# Patient Record
Sex: Female | Born: 1965 | Race: Black or African American | Hispanic: No | Marital: Married
Health system: Southern US, Community
[De-identification: ages and names within clinical notes are randomized; demographics above are authoritative.]

## PROBLEM LIST (undated history)

## (undated) DIAGNOSIS — T7840XA Allergy, unspecified, initial encounter: Secondary | ICD-10-CM

## (undated) DIAGNOSIS — D649 Anemia, unspecified: Secondary | ICD-10-CM

## (undated) DIAGNOSIS — M1990 Unspecified osteoarthritis, unspecified site: Secondary | ICD-10-CM

## (undated) DIAGNOSIS — F419 Anxiety disorder, unspecified: Secondary | ICD-10-CM

## (undated) DIAGNOSIS — G4730 Sleep apnea, unspecified: Secondary | ICD-10-CM

## (undated) HISTORY — PX: HIP ARTHROPLASTY: SHX981

## (undated) HISTORY — DX: Anxiety disorder, unspecified: F41.9

## (undated) HISTORY — PX: CHOLECYSTECTOMY: SHX55

## (undated) HISTORY — DX: Unspecified osteoarthritis, unspecified site: M19.90

## (undated) HISTORY — DX: Sleep apnea, unspecified: G47.30

## (undated) HISTORY — DX: Allergy, unspecified, initial encounter: T78.40XA

## (undated) HISTORY — DX: Anemia, unspecified: D64.9

## (undated) HISTORY — PX: BILATERAL CARPAL TUNNEL RELEASE: SHX6508

---

## 1997-02-18 HISTORY — PX: COLONOSCOPY: SHX174

## 1997-12-03 ENCOUNTER — Inpatient Hospital Stay (HOSPITAL_COMMUNITY): Admission: AD | Admit: 1997-12-03 | Discharge: 1997-12-05 | Payer: Self-pay | Admitting: Obstetrics and Gynecology

## 1998-04-24 ENCOUNTER — Other Ambulatory Visit: Admission: RE | Admit: 1998-04-24 | Discharge: 1998-04-24 | Payer: Self-pay | Admitting: Obstetrics and Gynecology

## 1999-04-26 ENCOUNTER — Other Ambulatory Visit: Admission: RE | Admit: 1999-04-26 | Discharge: 1999-04-26 | Payer: Self-pay | Admitting: Obstetrics and Gynecology

## 1999-09-03 ENCOUNTER — Ambulatory Visit (HOSPITAL_BASED_OUTPATIENT_CLINIC_OR_DEPARTMENT_OTHER): Admission: RE | Admit: 1999-09-03 | Discharge: 1999-09-03 | Payer: Self-pay | Admitting: Orthopedic Surgery

## 1999-10-08 ENCOUNTER — Ambulatory Visit (HOSPITAL_BASED_OUTPATIENT_CLINIC_OR_DEPARTMENT_OTHER): Admission: RE | Admit: 1999-10-08 | Discharge: 1999-10-08 | Payer: Self-pay | Admitting: Orthopedic Surgery

## 2000-06-22 ENCOUNTER — Inpatient Hospital Stay (HOSPITAL_COMMUNITY): Admission: AD | Admit: 2000-06-22 | Discharge: 2000-06-24 | Payer: Self-pay | Admitting: Obstetrics and Gynecology

## 2000-07-30 ENCOUNTER — Other Ambulatory Visit: Admission: RE | Admit: 2000-07-30 | Discharge: 2000-07-30 | Payer: Self-pay | Admitting: Obstetrics and Gynecology

## 2001-05-19 ENCOUNTER — Other Ambulatory Visit: Admission: RE | Admit: 2001-05-19 | Discharge: 2001-05-19 | Payer: Self-pay | Admitting: Obstetrics and Gynecology

## 2002-06-30 ENCOUNTER — Other Ambulatory Visit: Admission: RE | Admit: 2002-06-30 | Discharge: 2002-06-30 | Payer: Self-pay | Admitting: Obstetrics and Gynecology

## 2003-07-04 ENCOUNTER — Other Ambulatory Visit: Admission: RE | Admit: 2003-07-04 | Discharge: 2003-07-04 | Payer: Self-pay | Admitting: Obstetrics and Gynecology

## 2004-04-08 ENCOUNTER — Encounter: Admission: RE | Admit: 2004-04-08 | Discharge: 2004-04-08 | Payer: Self-pay | Admitting: Orthopedic Surgery

## 2004-05-08 ENCOUNTER — Encounter: Admission: RE | Admit: 2004-05-08 | Discharge: 2004-05-08 | Payer: Self-pay | Admitting: Orthopedic Surgery

## 2004-06-15 ENCOUNTER — Encounter: Admission: RE | Admit: 2004-06-15 | Discharge: 2004-06-15 | Payer: Self-pay | Admitting: Orthopedic Surgery

## 2004-07-04 ENCOUNTER — Other Ambulatory Visit: Admission: RE | Admit: 2004-07-04 | Discharge: 2004-07-04 | Payer: Self-pay | Admitting: Obstetrics and Gynecology

## 2005-02-01 ENCOUNTER — Ambulatory Visit (HOSPITAL_COMMUNITY): Admission: RE | Admit: 2005-02-01 | Discharge: 2005-02-02 | Payer: Self-pay | Admitting: Orthopedic Surgery

## 2006-03-19 IMAGING — CR DG CHEST 2V
2 series · 2 of 2 positions shown · non-contrast
Comparison: none

CLINICAL DATA: Pre-op left hip surgery. 
 CHEST - 2 VIEW: 
 No comparison. 
 The heart is mildly enlarged. There is no heart failure, infiltrate or effusion.   The lungs are clear.

[view not recorded (1 of 2)]
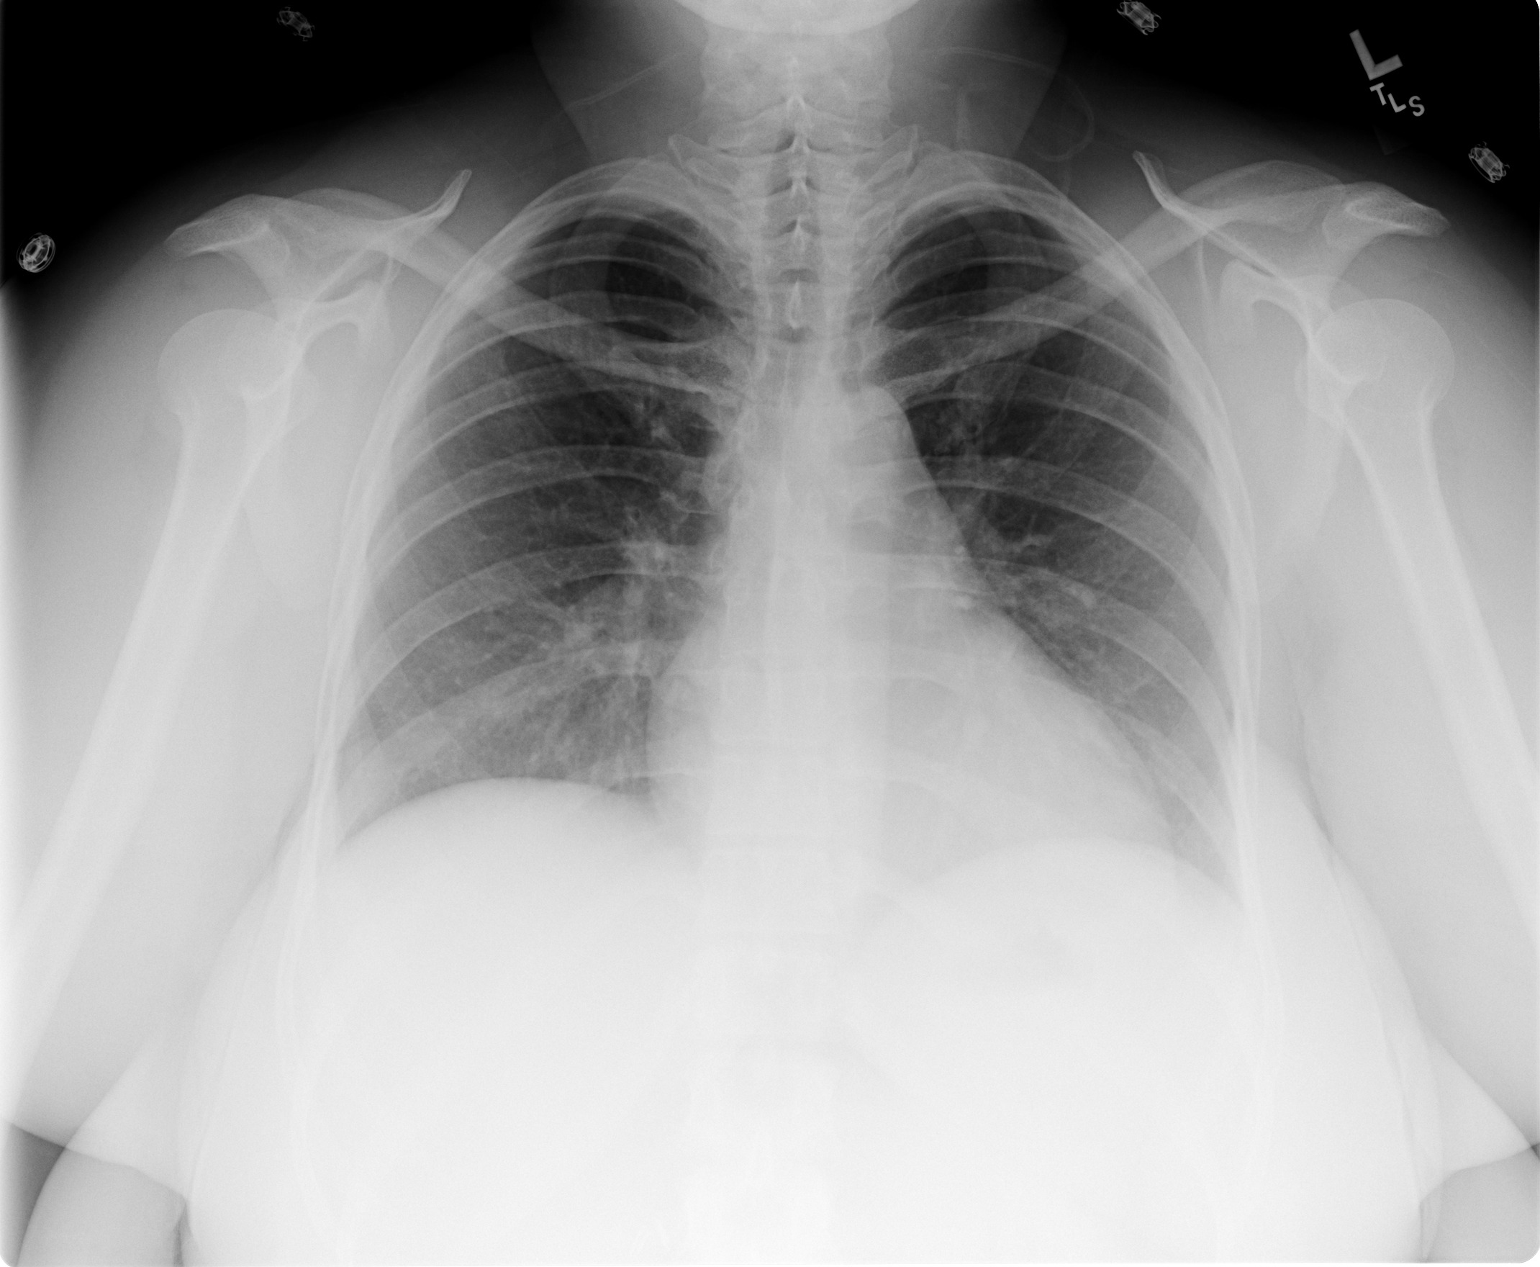

[view not recorded (2 of 2)]
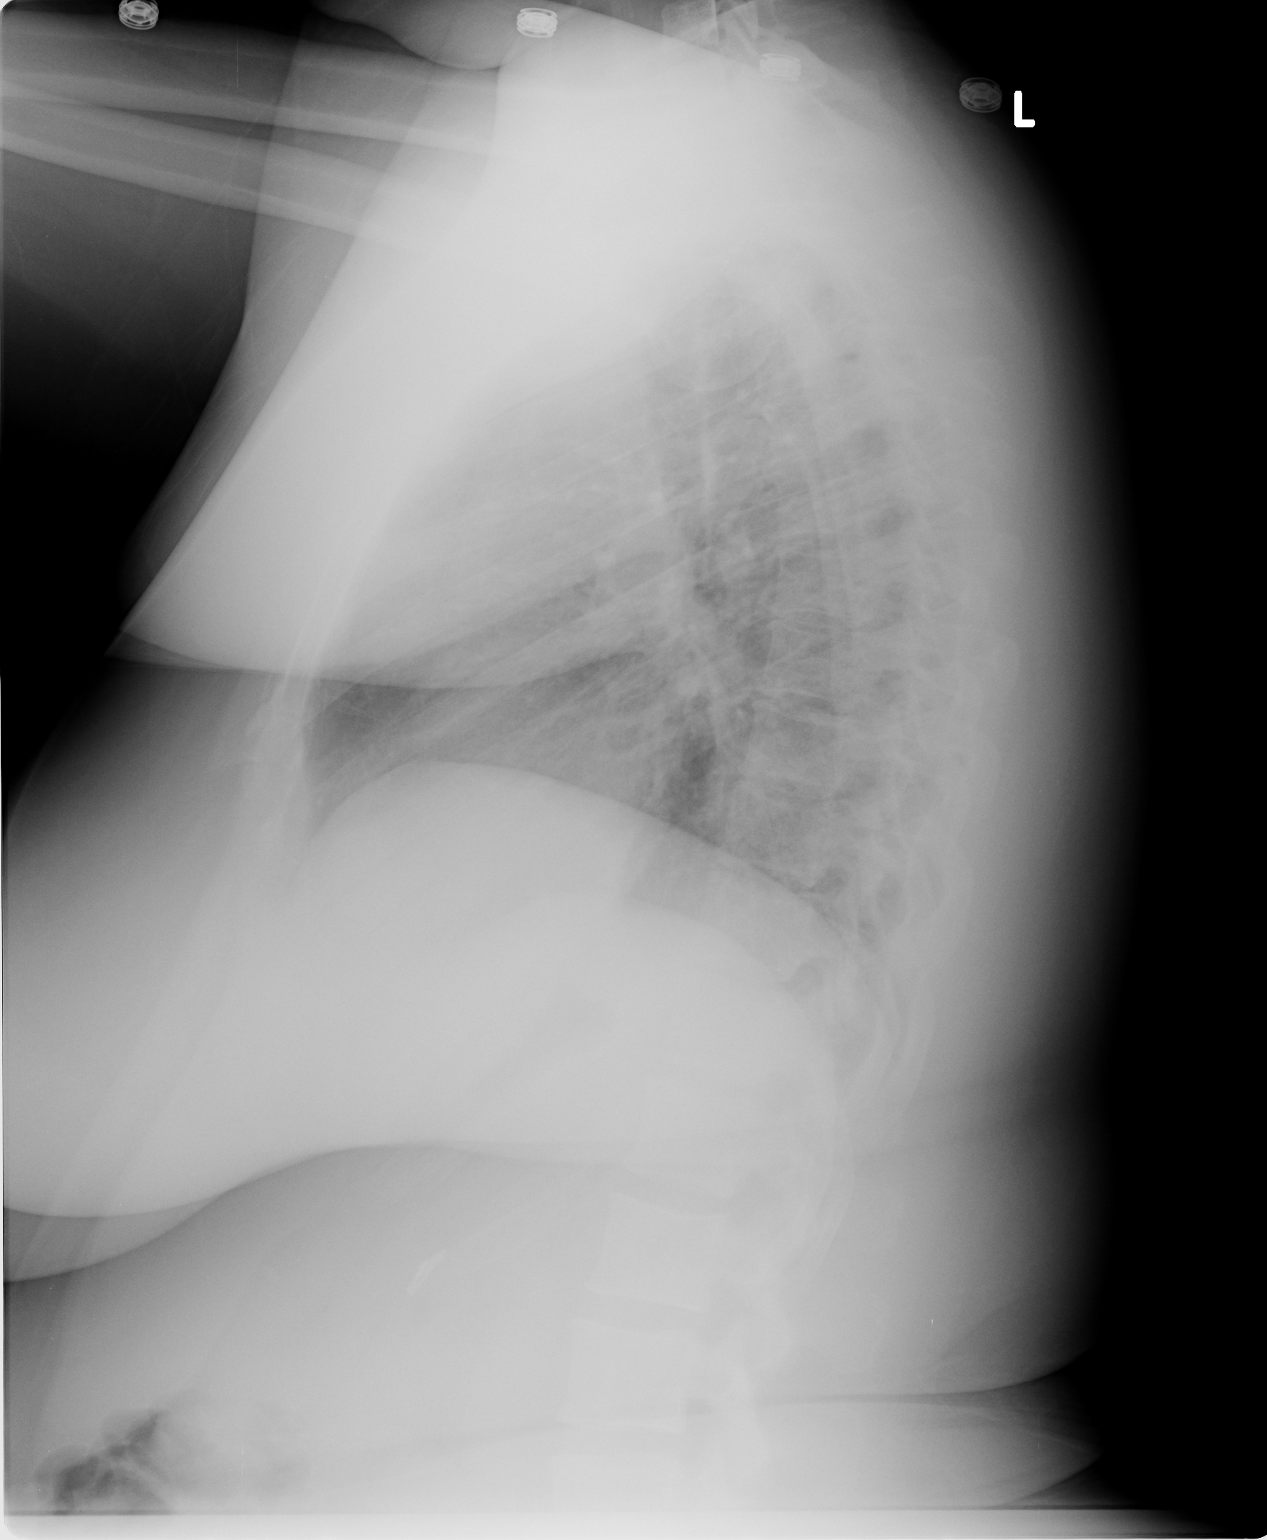

[2 of 2 positions shown; findings below may reference images not displayed]

IMPRESSION: No active disease.

## 2007-02-04 ENCOUNTER — Inpatient Hospital Stay (HOSPITAL_COMMUNITY): Admission: RE | Admit: 2007-02-04 | Discharge: 2007-02-08 | Payer: Self-pay | Admitting: Orthopedic Surgery

## 2007-03-12 ENCOUNTER — Encounter: Admission: RE | Admit: 2007-03-12 | Discharge: 2007-03-12 | Payer: Self-pay | Admitting: Orthopedic Surgery

## 2008-07-20 ENCOUNTER — Encounter: Admission: RE | Admit: 2008-07-20 | Discharge: 2008-07-20 | Payer: Self-pay | Admitting: Obstetrics and Gynecology

## 2008-10-25 ENCOUNTER — Encounter: Admission: RE | Admit: 2008-10-25 | Discharge: 2008-10-25 | Payer: Self-pay | Admitting: Obstetrics and Gynecology

## 2008-12-07 ENCOUNTER — Ambulatory Visit (HOSPITAL_COMMUNITY): Admission: RE | Admit: 2008-12-07 | Discharge: 2008-12-07 | Payer: Self-pay | Admitting: Orthopedic Surgery

## 2009-10-31 ENCOUNTER — Encounter: Admission: RE | Admit: 2009-10-31 | Discharge: 2009-10-31 | Payer: Self-pay | Admitting: Orthopedic Surgery

## 2010-07-03 NOTE — Op Note (Signed)
NAMEVERLISA, VARA              ACCOUNT NO.:  [id number]   MEDICAL RECORD NO.:  [id number]          PATIENT TYPE:  INP   LOCATION:  2550                         FACILITY:  [facility]   PHYSICIAN:  Feliberto Gottron. Turner Daniels, M.D.   DATE OF BIRTH:  12-10-1965   DATE OF PROCEDURE:  02/04/2007  DATE OF DISCHARGE:                               OPERATIVE REPORT   PREOPERATIVE DIAGNOSIS:  End-stage arthritis of the left hip.   POSTOPERATIVE DIAGNOSIS:  End-stage arthritis of the left hip.   PROCEDURE:  Left total hip arthroplasty using a 48-mm DePuy ASR metal  cup, a 16 x 11 x 36 x 150 SROM stem and a 16 B small cone.   SURGEON:  Feliberto Gottron.  Turner Daniels, MD   FIRST ASSISTANT:  Skip Mayer PA-C.   ANESTHETIC:  General endotracheal.   ESTIMATED BLOOD LOSS:  400 mL.   FLUID REPLACEMENT:  1200 mL crystalloid.   DRAINS PLACED:  Two medium Hemovac drains, one deep and then one in the  subcu and a Foley catheter.   URINE OUTPUT:  About 400 mL.   INDICATIONS FOR PROCEDURE:  A 45 year old woman, 4 feet 8 inches tall,  weighing about 260 pounds with end-stage arthritis of her left hip, who  has already undergone conservative treatment with anti-inflammatory  medicines, physical therapy, judicious use of narcotics and had a hip  arthroscopy accomplished that showed some bare bone arthritic changes.  Because of this, she is taken for total hip arthroplasty.  Risks and  benefits of surgery discussed preoperatively.  She is morbidly obese.  She understands that weight loss is needed to help ensure the success of  her surgery but she is desperate for pain relief.  Risks and benefits of  surgery discussed, questions answered.   DESCRIPTION OF PROCEDURE:  The patient identified by armband, taken to  the operating room at [facility] where appropriate anesthetic  monitors were attached and general endotracheal anesthesia was induced  with the patient in supine position.  She was then rolled into the right  lateral decubitus position and fixed there with a Stulberg Mark II  pelvic clamp after first inserting a Foley catheter.  She also received  2 grams of Ancef preoperatively.  After positioning the right lower  extremity was prepped and draped in usual sterile fashion from the ankle  to the hemipelvis and the skin along the lateral hip and thigh  infiltrated 20 mL of 0.5% Marcaine and epinephrine solution we then  began the procedure, performing a standard time-out, making a  posterolateral approach to the hip utilizing a 15 inches incision  centered over the greater trochanter.  Small bleeders in the skin and  subcutaneous tissue which was about 6 inches in thickness were  identified and cauterized.  We then found the IT band on top of the  greater trochanter, split in line with skin incision, exposing the short  external rotators and the piriformis.  Cobra retractors were placed  between the superior aspect of the hip joint capsule and the gluteus  minimus and between the inferior aspect of the hip  joint capsule and the  quadratus femoris.  This isolated the short external rotators and  piriformis which were then tagged with a #2 Ethibond suture and cut off  their insertion on the intertrochanteric crest.  Hip joint capsule was  then exposed and developed into an acetabular based flap and was incised  from posterior-superior on the acetabulum over the femoral neck and then  posterior inferior developing an acetabular based flap as well.  This  was also tagged with two  #2 Ethibond sutures exposing the arthritic  femoral head.  Hip was then flexed, internally rotated dislocating the  arthritic femoral head.  The standard neck cut was performed one  fingerbreadth above the lesser trochanter.  The proximal femur was then  translated anteriorly, levering off the anterior column with a Homan  retractor exposing the acetabulum we performed a standard labrectomy and  placed posterior-superior  and posterior inferior wing retractors,  enhancing our exposure of the acetabulum.  We then sequentially reamed  up to a 47 mm basket reamer obtaining good coverage in all quadrants and  then reamed it medially to the inner wall the teardrop to enhance our  coverage.  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  Satisfied with the reaming we then opened up  a 48-mm DePuy ASR cup and hammered it into place obtaining good firm  fixation on the inserter.  The cup was positioned about 45 degrees of  abduction and 15 degrees of anteversion.  No significant peripheral  osteophytes were noted because of her preexisting [facility].  Again her cup  was relatively shallow.  At this point the hip was flexed and internally  rotated exposing the proximal femur which was entered with a box cutting  chisel, initiating reamer and then sequentially reamed up to 11.5 mm,  although it should be noted her bone was incredibly hard and we actually  went in 0.5 mm increments from 8 mm to 11.5 because we had chatter all  the way through.  We then conically reamed up to a 16B cone set for a 36  neck and conical and calcar milled up to a B small calcar piece.  A  trial 16B cone was then inserted followed by the trial stem with an NK  +0 43-mm trial ball.  The hip was then reduced.  It could not be  dislocated in extension and external rotation and you could flex her up  to about 80 degrees flexion with 70 of internal rotation.  She was  stable.  At this point the trial components were removed and the real  16B small ZTT 1 beaded cone was then inserted into the femur and  obtained a good firm fixation.  Through the real cone we reamed to 11.5  again and then inserted the 16 x 11 x 36 stem in the same version as the  calcar which was about 20-25 degrees.  Once the stem had seated an NK +0  43 mm ball was hammered on the stem.  Hip was again reduced and  stability noted to be  excellent.  At this point we thoroughly irrigated  out the wound with normal saline solution and because of the patient's  body habitus elected to place a deep Hemovac drain and a second Hemovac  drain in the subcutaneous closure.  The iliotibial band was then closed  with running #1 Vicryl suture.  The subcutaneous tissue in four layers  using 0 Vicryl suture  and then multiple layers of 2-0 Vicryl suture  about every inch or two on the way out.  The skin was then closed with  skin staples and a dressing of Mepilex applied.  At this point the  patient was unclamped, rolled supine, awakened and taken to the recovery  room without difficulty.      Feliberto Gottron. Turner Daniels, M.D.  Electronically Signed     FJR/MEDQ  D:  02/04/2007  T:  02/05/2007  Job:  [id number]

## 2010-07-06 NOTE — Op Note (Signed)
Angela Ward, Angela Ward              ACCOUNT NO.:  [id number]   MEDICAL RECORD NO.:  [id number]          PATIENT TYPE:  OIB   LOCATION:  5002                         FACILITY:  [facility]   PHYSICIAN:  Burnard Bunting, M.D.    DATE OF BIRTH:  24-May-1965   DATE OF PROCEDURE:  02/01/2005  DATE OF DISCHARGE:  02/02/2005                                 OPERATIVE REPORT   PREOPERATIVE DIAGNOSIS:  Left hip labral tear.   POSTOPERATIVE DIAGNOSIS:  Left hip labral tear.   PROCEDURE:  Left hip diagnostic and operative arthroscopy with debridement  of labrum.   SURGEON:  Burnard Bunting, M.D.   ASSISTANT:  Vanita Panda. Magnus Ivan, M.D.   ANESTHESIA:  General endotracheal.   ESTIMATED BLOOD LOSS:  Minimal.   PROCEDURE IN DETAIL:  The patient was brought to the operating room, where  general endotracheal anesthesia was induced.  The patient was placed on the  fracture table with the right leg placed in the lithotomy position and the  left leg under traction.  The leg, hip and groin was prepped with DuraPrep  solution and draped in a sterile manner.  The topographic anatomy of the  left hip was identified including the anterior superior iliac crest and the  greater trochanter.  Under fluoroscopic guidance, the localizing needle was  used to enter the hip joint from the anterolateral portal.  This followed  the course of the femoral neck and followed the patient's native  anteversion.  Then under direct visualization and fluoroscopic guidance,  anterior portal was created slightly distal to the anterior superior iliac  crest.  The femoral head was inspected and found to have significant  degenerative changes with some grade 3 and periodic grade 4 chondromalacia.  Loose chondral fragments were irrigated out from the joint.  Anterior labral  tear was identified and debrided using the shaver.  The posterior labrum was  intact.  Following debridement of the labral tear, the instruments were  reversed and  no posterior pathology was noted.  Instruments were then  removed at this time.  It should be noted that traction was required in  order to break the seal of the hip in order to facilitate instrument  passage.  This was carefully monitored under fluoroscopic guidance.  Following removal of the instruments, the incisions were closed using 3-0  nylon suture.  It should be noted that about a 5 cm incision was required on  the anterolateral portal because of the width of the patient's soft tissue  envelope.  This incision was closed using a 2-0 Vicryl suture and 3-0 nylon.  Impervious dressings were placed.  The patient tolerated the procedure well  without immediate complications.           ______________________________  G. Dorene Grebe, M.D.     GSD/MEDQ  D:  02/05/2005  T:  02/07/2005  Job:  [id number]

## 2010-07-06 NOTE — Op Note (Signed)
[place]. [facility]  Patient:    Angela Ward, Angela Ward                MRN: [id number] Proc. Date: 09/03/99 Adm. Date:  09/03/99 Attending:  Ronne Binning                           Operative Report  PREOPERATIVE DIAGNOSES: 1. Right shoulder impingement syndrome, AC joint arthritis and partial    thickness rotator cuff tear.  POSTOPERATIVE DIAGNOSES: 1. Right shoulder impingement syndrome, AC joint arthritis and partial    thickness rotator cuff tear. 2. Degenerative tear of the of the superior labrum.  PROCEDURE:   Right shoulder arthroscopy anterior inferior acromioplasty, excision of distal clavicle, debridement of superior labral tear degenerative as well as partial thickness internal leaflet rotator cuff tear.  SURGEON:  Alinda Deem, M.D.  FIRST ASSISTANT:  Dorthula Matas, P.A.-C.  ANESTHESIA:  Interscalene block plus general LMA.  ESTIMATED BLOOD LOSS:  Minimal.  FLUID REPLACEMENT:  1 liter of crystalloid.  DRAINS PLACED:  None.  TOURNIQUET TIME:  None.  INDICATIONS:  A 45 year old woman who I believe was injured at work some time ago and has had right shoulder impingement syndrome since then.  A cortisone injection gave her about 70% pain relief.  She had positive impingement signs, positive AC joint irritation with spurring, of the subacromial region, about 1 cm in the distal clavicle and also had an inferior spur.  MRI scan revealed a partial thickness internal leaflet rotator cuff tear. EMGs and MCVs done preoperatively were unremarkable.  In any event she has failed conservative treatment, anti-inflammatory medicine, job modification, therapy and exercises and a cortisone shot only gave her a few days worth of pain relief.  She desires elective decompression of the shoulder and evaluation of the cuff.  DESCRIPTION OF PROCEDURE:  The patient was identified by arm band and taken to the operating room at [facility]. Appropriate anesthetic monitors were attached, and interscalene block anesthesia induced to the right upper extremity.  She then underwent general LMA anesthesia and was placed in the beach chair position, and the right upper extremity was prepped and draped in the usual sterile fashion from the wrist to the hemithorax.  The skin along the anterolateral and posterior aspects of the acromion process was infiltrated with 2-3 cc of 0.50% Marcaine and epinephrine solution.  Using a #11 blade we then made standard stab wounds 1/2 cm anterior to the [facility] joint and lateral to the junction of the middle and posterior 1/3 of the acromion and the posterolateral corner of the acromion process.  The inflow was placed anteriorly, the arthroscope laterally and a 4.2 mm Barracuda sucker shaver posteriorly.  Inflamed subacromial bursa was removed as was a portion of the CA ligament that was covering the anterior acromial spur.  Once this had been outlined it was removed with 2 passes of a 4.5 mm hooded vortex bur.  This revealed the distal clavicle which also had a subclavicular spur and what appeared to be some end stage arthritic changes.  Utilizing the posterior portal were able to take off about 8 to 9 mm of the distal clavicle posteriorly all the way up to the superior AC ligament, completing the decompression.  The rotator cuff was evaluated and had only some minor partial thickness tearing exteriorly.  The arthroscope was then repositioned into the glenohumeral joint utilizing the posterior portal with  inflow on the scope. There was some degenerative tearing of the superior labrum noted and this was removed with a 4.2 mm Barracuda sucker shaver as well as an internal leaflet tear of the supraspinatus tendon near the biceps as it went into the bicipital groove.  This was also debrided.  No full thickness tearing of the rotator cuff was identified.  At this point the shoulder was  washed out with normal saline solution.  The arthroscopic instruments removed.  A dressing of xeroform, 4 x 8 dressings, sponged, and paper tape applied.  The patient was awakened and taken to the recovery room without difficulty.  Before and after surgery she is to going to be admonished to not smoke any more.  She is smoking 2 packs a day which I am sure would predispose her rotator cuff to some degenerative changes as well. DD:  09/03/99 TD:  09/03/99 Job: [id number]

## 2010-07-06 NOTE — Op Note (Signed)
[place]. [facility]  Patient:    Angela Ward, Angela Ward                     MRN: [id number] Proc. Date: 10/08/99 Adm. Date:  [id number] Attending:  Alinda Deem                           Operative Report  Unable to transcribe. DD:  10/08/99 TD:  10/08/99 Job: [id number]

## 2010-07-06 NOTE — Op Note (Signed)
. [facility]  Patient:    Angela Ward, Angela Ward                     MRN: [id number] Proc. Date: 10/08/99 Adm. Date:  [id number] Disc. Date: [id number] Attending:  Alinda Deem                           Operative Report  PREOPERATIVE DIAGNOSIS:  Right carpal tunnel syndrome.  POSTOPERATIVE DIAGNOSIS:  Right carpal tunnel syndrome.  OPERATION PERFORMED:  Right carpal tunnel release.  SURGEON:  Alinda Deem, M.D.  ASSISTANT:  None.  ANESTHESIA:  General LMA.  ESTIMATED BLOOD LOSS:  Minimal.  FLUID REPLACEMENT:  200 cc crystalloid.  TOURNIQUET TIME:  15 minutes.  APPLIANCES:  Velfoam splint.  INDICATIONS FOR PROCEDURE:  The patient is a 45 year old woman with right carpal tunnel syndrome.  She has failed conservative treatment with anti-inflammatory medicine, splinting, observation and has carpal tunnel syndrome on the right side proved by EMGs.  She desires right carpal tunnel release to decrease pain and increase function.  DESCRIPTION OF PROCEDURE:  The patient was identified by arm band and taken to the operating room at [facility] where the appropriate anesthetic monitors were attached and general LMA anesthesia induced with the patient in the supine position.  A right forearm tourniquet was then applied and the right hand prepped and draped in the usual sterile fashion from the fingertips to the tourniquet.  The limb was wrapped with an Esmarch bandage, tourniquet inflated to 300 mmHg.  We began the procedure by making a volar midline incision starting at the wrist flexion crease going distally and just to the ulnar side of the thenar crease for a distance of about 4 to 5 cm through the skin and into the subcutaneous tissues.  We identified the transverse palmar fascia distally and a small incision was made in this with a #15 blade allowing passage of a Freer elevator into the carpal tunnel volarly.  With the [facility]  elevator firmly against the volar aspect of the transverse ligament, we then cut down on the transverse ligament, performing the carpal tunnel release and explored the tendons and median nerve of the carpal tunnel and found all structures to be intact.  The median nerve was ____________ with an hourglass configuration consistent with a carpal tunnel syndrome.  No ganglia or masses were found in the carpal tunnel.  At this point the wound was washed out with normal saline solution.  The skin only was closed with running 5-0 nylon suture.  A dressing of Xeroform, 4 x 8 dressing sponges, Webril and an Ace wrap applied along with a Velfoam splint.  The patient was awakened and taken to the recovery room without difficulty. DD:  10/29/99 TD:  10/29/99 Job: [id number]

## 2010-07-06 NOTE — Discharge Summary (Signed)
NAMEJYLA, HOPF              ACCOUNT NO.:  [id number]   MEDICAL RECORD NO.:  [id number]          PATIENT TYPE:  INP   LOCATION:  5022                         FACILITY:  [facility]   PHYSICIAN:  Feliberto Gottron. Turner Daniels, M.D.   DATE OF BIRTH:  1965/06/19   DATE OF ADMISSION:  02/04/2007  DATE OF DISCHARGE:  02/08/2007                               DISCHARGE SUMMARY   DIAGNOSIS:  End stage degenerative joint disease of the left hip.   DISCHARGE SUMMARY:  The patient is a 45 year old woman who is 4 feet 8  inches tall, weighing 260 pounds, with end stage arthritis of the left  hip, has undergone conservative treatment with antiinflammatory  medications, physical therapy, judicious use of narcotics and has had  arthroscopy films showed her hip showed some bone bare arthritic  changes.  Because of this she is taken for a total hip arthroplasty.  Risks and benefits of surgery are discussed preoperatively.  The patient  is morbidly obese.  She understands that weight loss is needed to help  ensure the success of the surgery but she is desperate for pain relief.  Risks and benefits of the surgery was discussed, questions were answered  and she elected to proceed.   ALLERGIES:  No known drug allergies.   MEDICATIONS:  Darvocet, Mircette, and Naprosyn which she discontinued  prior to the surgery.   PAST MEDICAL HISTORY:  1. Usual childhood diseases.  2. Adult history of DJD.   SURGICAL HISTORY:  1. Gallbladder.  2. Carpal tunnel release.  3. ________ scope.  No difficulties with GT.   SOCIAL HISTORY:  No tobacco, no ethanol, no IV drug abuse.  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   FAMILY HISTORY:  Mother is alive and well at age 12.  Father is alert  and well at age 36.   REVIEW OF SYSTEMS:  The patient denies shortness of breath, chest pain,  or recent illness.   PHYSICAL EXAMINATION:  VITAL SIGNS:  Temperature 97.9, pulse 84, blood  pressure 157/95.  The patient is a  4 feet 8,   260 pound  female.  HEENT:  Head is normocephalic atraumatic.  Ears:  TMs are clear.  Eyes:  Pupils are equal, round, and reactive to light and accommodation.  Nose:  Patent.  Throat:  Benign.  NECK:  Supple. Full range of motion.  CHEST:  Clear to auscultation percussion.  HEART:  Regular rate and rhythm.  ABDOMEN:  Soft, nontender.  EXTREMITIES:  Left hip range of motion 0 degrees of internal rotation, 5  degrees of external rotation, positive foot tap, and she is  neurovascularly intact.  SKIN:  Within normal limits.   X-rays show decreased cartilage at the left hip with bare bone changes  present on scope.  Preoperative labs including a CBC, C-MET, chest x-  ray, HG PT, PTT were within normal limits.   HOSPITAL COURSE:  On the day of admission, the patient was taken to the  operating room in [facility] where she underwent a left total  hip arthroplasty using a 48-mm DePuy  ASR metal cup, a 16 x 11 x 36 x 150  SROM  stem, a 16B small cone, and an Enclave plus a 0.43-mm ball.  Medium Hemovac drain was placed into the wound.  Foley catheter was  placed perioperatively.  The patient was placed on perioperative  antibiotics.  The patient was placed on postoperative Coumadin  prophylaxis bridging Lovenox therapy, also placed on postoperative PCA  Dilaudid for pain control.  Postoperative day one:  The patient was awake and alert.  The pain was  managed with PCA.  No nausea or vomiting.  Taking p.o. well.  Vital  signs are stable.  Urine output 500 mL.  Hemoglobin 9.6, PT 12.5, INR  0.9.  Wound had scant bloody drainage.  Negative foot tap.  X-rays show  well placed, well fixed total hip.  Drain output 75 mL and discontinued.  Assessment:  The patient was stable left total hip arthroplasty,  morbidly obese, otherwise stable.  Postoperative day two:  The patient was awake and alert.  The pain was  slowly diminishing.  Positive flatus.  Vital signs are stable.  Hemoglobin 9, WBC  13.8.  The patient has an INR of 1.3 as well as stable  and making progress in PT as well as occupational therapy.  Postoperative day three:  The patient's pain was well controlled.  She  had some minimal nausea.  The incision was clean and dry.  Hemoglobin  8.4.  Otherwise, neurovascularly intact.  PCA was discontinued.  Postoperative day four:  The patient stated that she felt she was ready  to go home.  INR 1.9.  Hemoglobin 8.4.  She was afebrile and vital signs  were stable.  Wound dressing was clean and dry.  No signs of infection.  The patient was transferring independently and met physical therapy  goals.  She was discharged home to the care of her family.   She was given prescriptions for ferrous sulfate, Percocet, Robaxin, and  Coumadin.  She resumed home meds as per the home med reconciliation  sheet.   ACTIVITY:  Weightbearing as tolerated with total hip precautions and  walker.   DRESSING CHANGES:  Daily or as needed.   DIET:  Regular.   Return to the clinic in one week's time for followup, calling [phone]  for followup check, sooner if she is having any difficulty with  increased pain, increased drainage, or fever over 101.      Laural Benes. Jannet Mantis.      Feliberto Gottron. Turner Daniels, M.D.  Electronically Signed    JBR/MEDQ  D:  03/13/2007  T:  03/13/2007  Job:  [id number]

## 2010-11-23 LAB — BASIC METABOLIC PANEL
CO2: 31
Chloride: 101
GFR calc Af Amer: >60
Potassium: 3.8
Sodium: 139

## 2010-11-23 LAB — DIFFERENTIAL
Basophils Relative: 0
Eosinophils Absolute: 0.1 — ABNORMAL LOW
Eosinophils Relative: 1
Lymphs Abs: 1.6
Monocytes Absolute: 0.5
Monocytes Relative: 4
Neutrophils Relative %: 83 — ABNORMAL HIGH

## 2010-11-23 LAB — CBC
HCT: 25 — ABNORMAL LOW
HCT: 25.2 — ABNORMAL LOW
HCT: 27.7 — ABNORMAL LOW
Hemoglobin: 8.4 — ABNORMAL LOW
Hemoglobin: 8.4 — ABNORMAL LOW
Hemoglobin: 9 — ABNORMAL LOW
MCHC: 33.3
MCHC: 33.8
MCV: 85.1
MCV: 86.1
MCV: 86.9
Platelets: 279
RBC: 2.88 — ABNORMAL LOW
RBC: 2.9 — ABNORMAL LOW
RBC: 3.12 — ABNORMAL LOW
RBC: 3.25 — ABNORMAL LOW
RDW: 12.9
WBC: 11.2 — ABNORMAL HIGH
WBC: 13.5 — ABNORMAL HIGH
WBC: 13.8 — ABNORMAL HIGH

## 2010-11-23 LAB — PROTIME-INR
INR: 1.3
INR: 1.9 — ABNORMAL HIGH
INR: 2.1 — ABNORMAL HIGH
Prothrombin Time: 12.5
Prothrombin Time: 16.2 — ABNORMAL HIGH

## 2010-11-26 LAB — CBC
HCT: 35.8 — ABNORMAL LOW
MCHC: 33.5
MCV: 86.2
Platelets: 309
WBC: 8.9

## 2010-11-26 LAB — COMPREHENSIVE METABOLIC PANEL
AST: 17
Albumin: 3.7
BUN: 8
CO2: 24
Calcium: 9.1
Chloride: 102
Creatinine, Ser: 0.62
GFR calc Af Amer: >60
GFR calc non Af Amer: >60
Total Bilirubin: 0.4

## 2010-11-26 LAB — URINALYSIS, ROUTINE W REFLEX MICROSCOPIC
Bilirubin Urine: NEGATIVE
Glucose, UA: NEGATIVE
Hgb urine dipstick: NEGATIVE
Ketones, ur: NEGATIVE
Protein, ur: NEGATIVE
Urobilinogen, UA: 0.2

## 2010-11-26 LAB — DIFFERENTIAL
Basophils Absolute: 0
Lymphocytes Relative: 20
Lymphs Abs: 1.8
Neutro Abs: 6.6

## 2010-11-26 LAB — TYPE AND SCREEN
ABO/RH(D): A POS
Antibody Screen: NEGATIVE

## 2010-11-26 LAB — PROTIME-INR: Prothrombin Time: 12

## 2010-11-26 LAB — APTT: aPTT: 26

## 2012-01-27 ENCOUNTER — Ambulatory Visit: Payer: Federal, State, Local not specified - PPO | Attending: Family Medicine | Admitting: Physical Therapy

## 2012-01-27 DIAGNOSIS — M546 Pain in thoracic spine: Secondary | ICD-10-CM | POA: Insufficient documentation

## 2012-01-27 DIAGNOSIS — IMO0001 Reserved for inherently not codable concepts without codable children: Secondary | ICD-10-CM | POA: Insufficient documentation

## 2012-01-27 DIAGNOSIS — M545 Low back pain, unspecified: Secondary | ICD-10-CM | POA: Insufficient documentation

## 2012-01-27 DIAGNOSIS — M542 Cervicalgia: Secondary | ICD-10-CM | POA: Insufficient documentation

## 2012-01-31 ENCOUNTER — Ambulatory Visit: Payer: Federal, State, Local not specified - PPO | Admitting: Physical Therapy

## 2012-02-03 ENCOUNTER — Ambulatory Visit: Payer: Federal, State, Local not specified - PPO | Attending: Family Medicine | Admitting: Physical Therapy

## 2012-02-03 DIAGNOSIS — IMO0001 Reserved for inherently not codable concepts without codable children: Secondary | ICD-10-CM | POA: Insufficient documentation

## 2012-02-03 DIAGNOSIS — M542 Cervicalgia: Secondary | ICD-10-CM | POA: Insufficient documentation

## 2012-02-03 DIAGNOSIS — M546 Pain in thoracic spine: Secondary | ICD-10-CM | POA: Insufficient documentation

## 2012-02-03 DIAGNOSIS — M545 Low back pain, unspecified: Secondary | ICD-10-CM | POA: Insufficient documentation

## 2012-02-03 DIAGNOSIS — M2569 Stiffness of other specified joint, not elsewhere classified: Secondary | ICD-10-CM | POA: Insufficient documentation

## 2012-02-04 ENCOUNTER — Ambulatory Visit: Payer: Federal, State, Local not specified - PPO | Admitting: Physical Therapy

## 2012-02-06 ENCOUNTER — Ambulatory Visit: Payer: Federal, State, Local not specified - PPO | Admitting: Physical Therapy

## 2012-02-10 ENCOUNTER — Ambulatory Visit: Payer: Federal, State, Local not specified - PPO | Attending: Family Medicine | Admitting: Physical Therapy

## 2012-02-10 DIAGNOSIS — M542 Cervicalgia: Secondary | ICD-10-CM | POA: Insufficient documentation

## 2012-02-10 DIAGNOSIS — M545 Low back pain, unspecified: Secondary | ICD-10-CM | POA: Insufficient documentation

## 2012-02-10 DIAGNOSIS — IMO0001 Reserved for inherently not codable concepts without codable children: Secondary | ICD-10-CM | POA: Insufficient documentation

## 2012-02-10 DIAGNOSIS — M546 Pain in thoracic spine: Secondary | ICD-10-CM | POA: Insufficient documentation

## 2012-02-10 DIAGNOSIS — M2569 Stiffness of other specified joint, not elsewhere classified: Secondary | ICD-10-CM | POA: Insufficient documentation

## 2012-02-17 ENCOUNTER — Ambulatory Visit: Payer: Federal, State, Local not specified - PPO | Attending: Family Medicine | Admitting: Physical Therapy

## 2012-02-17 DIAGNOSIS — M2569 Stiffness of other specified joint, not elsewhere classified: Secondary | ICD-10-CM | POA: Insufficient documentation

## 2012-02-17 DIAGNOSIS — IMO0001 Reserved for inherently not codable concepts without codable children: Secondary | ICD-10-CM | POA: Insufficient documentation

## 2012-02-17 DIAGNOSIS — M545 Low back pain, unspecified: Secondary | ICD-10-CM | POA: Insufficient documentation

## 2012-02-17 DIAGNOSIS — M542 Cervicalgia: Secondary | ICD-10-CM | POA: Insufficient documentation

## 2012-02-17 DIAGNOSIS — M546 Pain in thoracic spine: Secondary | ICD-10-CM | POA: Insufficient documentation

## 2012-02-21 ENCOUNTER — Ambulatory Visit: Payer: No Typology Code available for payment source | Attending: Family Medicine | Admitting: Physical Therapy

## 2012-02-21 DIAGNOSIS — M546 Pain in thoracic spine: Secondary | ICD-10-CM | POA: Insufficient documentation

## 2012-02-21 DIAGNOSIS — M542 Cervicalgia: Secondary | ICD-10-CM | POA: Insufficient documentation

## 2012-02-21 DIAGNOSIS — M545 Low back pain, unspecified: Secondary | ICD-10-CM | POA: Insufficient documentation

## 2012-02-21 DIAGNOSIS — Z96649 Presence of unspecified artificial hip joint: Secondary | ICD-10-CM | POA: Insufficient documentation

## 2012-02-21 DIAGNOSIS — IMO0001 Reserved for inherently not codable concepts without codable children: Secondary | ICD-10-CM | POA: Insufficient documentation

## 2012-02-26 ENCOUNTER — Ambulatory Visit: Payer: No Typology Code available for payment source | Attending: Family Medicine | Admitting: Physical Therapy

## 2012-02-26 DIAGNOSIS — M545 Low back pain, unspecified: Secondary | ICD-10-CM | POA: Insufficient documentation

## 2012-02-26 DIAGNOSIS — M542 Cervicalgia: Secondary | ICD-10-CM | POA: Insufficient documentation

## 2012-02-26 DIAGNOSIS — M546 Pain in thoracic spine: Secondary | ICD-10-CM | POA: Insufficient documentation

## 2012-02-26 DIAGNOSIS — IMO0001 Reserved for inherently not codable concepts without codable children: Secondary | ICD-10-CM | POA: Insufficient documentation

## 2012-02-26 DIAGNOSIS — Z96649 Presence of unspecified artificial hip joint: Secondary | ICD-10-CM | POA: Insufficient documentation

## 2012-03-03 ENCOUNTER — Ambulatory Visit: Payer: No Typology Code available for payment source | Admitting: Physical Therapy

## 2012-03-05 ENCOUNTER — Ambulatory Visit: Payer: No Typology Code available for payment source | Admitting: Physical Therapy

## 2012-03-17 ENCOUNTER — Other Ambulatory Visit: Payer: Self-pay | Admitting: Family Medicine

## 2012-03-17 DIAGNOSIS — R52 Pain, unspecified: Secondary | ICD-10-CM

## 2012-03-17 DIAGNOSIS — M545 Low back pain: Secondary | ICD-10-CM

## 2012-03-22 ENCOUNTER — Ambulatory Visit
Admission: RE | Admit: 2012-03-22 | Discharge: 2012-03-22 | Disposition: A | Discharge status: Home / Self Care | Payer: Federal, State, Local not specified - PPO | Source: Ambulatory Visit | Attending: Family Medicine | Admitting: Family Medicine

## 2012-03-22 DIAGNOSIS — R52 Pain, unspecified: Secondary | ICD-10-CM

## 2012-03-22 DIAGNOSIS — M545 Low back pain: Secondary | ICD-10-CM

## 2013-08-19 ENCOUNTER — Other Ambulatory Visit: Payer: Self-pay | Admitting: Obstetrics and Gynecology

## 2013-08-19 DIAGNOSIS — R928 Other abnormal and inconclusive findings on diagnostic imaging of breast: Secondary | ICD-10-CM

## 2013-08-30 ENCOUNTER — Other Ambulatory Visit: Payer: Self-pay

## 2013-09-01 ENCOUNTER — Other Ambulatory Visit: Payer: Self-pay

## 2013-09-02 ENCOUNTER — Ambulatory Visit
Admission: RE | Admit: 2013-09-02 | Discharge: 2013-09-02 | Disposition: A | Discharge status: Home / Self Care | Payer: Federal, State, Local not specified - PPO | Source: Ambulatory Visit | Attending: Obstetrics and Gynecology | Admitting: Obstetrics and Gynecology

## 2013-09-02 DIAGNOSIS — R928 Other abnormal and inconclusive findings on diagnostic imaging of breast: Secondary | ICD-10-CM

## 2014-02-15 ENCOUNTER — Other Ambulatory Visit: Payer: Self-pay | Admitting: Obstetrics and Gynecology

## 2014-02-15 DIAGNOSIS — N6001 Solitary cyst of right breast: Secondary | ICD-10-CM

## 2014-03-04 ENCOUNTER — Ambulatory Visit
Admission: RE | Admit: 2014-03-04 | Discharge: 2014-03-04 | Disposition: A | Discharge status: Home / Self Care | Payer: Federal, State, Local not specified - PPO | Source: Ambulatory Visit | Attending: Obstetrics and Gynecology | Admitting: Obstetrics and Gynecology

## 2014-03-04 ENCOUNTER — Other Ambulatory Visit: Payer: Self-pay

## 2014-03-04 DIAGNOSIS — N6001 Solitary cyst of right breast: Secondary | ICD-10-CM

## 2014-07-25 ENCOUNTER — Other Ambulatory Visit: Payer: Self-pay | Admitting: Obstetrics and Gynecology

## 2014-07-25 DIAGNOSIS — N6001 Solitary cyst of right breast: Secondary | ICD-10-CM

## 2014-08-03 ENCOUNTER — Other Ambulatory Visit: Payer: Self-pay

## 2014-08-18 ENCOUNTER — Ambulatory Visit
Admission: RE | Admit: 2014-08-18 | Discharge: 2014-08-18 | Disposition: A | Discharge status: Home / Self Care | Payer: Federal, State, Local not specified - PPO | Source: Ambulatory Visit | Attending: Obstetrics and Gynecology | Admitting: Obstetrics and Gynecology

## 2014-08-18 DIAGNOSIS — N6001 Solitary cyst of right breast: Secondary | ICD-10-CM

## 2018-11-18 ENCOUNTER — Other Ambulatory Visit: Payer: Self-pay | Admitting: Obstetrics and Gynecology

## 2018-11-18 DIAGNOSIS — R928 Other abnormal and inconclusive findings on diagnostic imaging of breast: Secondary | ICD-10-CM

## 2018-11-19 ENCOUNTER — Ambulatory Visit
Admission: RE | Admit: 2018-11-19 | Discharge: 2018-11-19 | Disposition: A | Discharge status: Home / Self Care | Payer: Federal, State, Local not specified - PPO | Source: Ambulatory Visit | Attending: Obstetrics and Gynecology | Admitting: Obstetrics and Gynecology

## 2018-11-19 ENCOUNTER — Other Ambulatory Visit: Payer: Self-pay

## 2018-11-19 DIAGNOSIS — R928 Other abnormal and inconclusive findings on diagnostic imaging of breast: Secondary | ICD-10-CM

## 2019-04-25 ENCOUNTER — Ambulatory Visit: Payer: Self-pay | Attending: Internal Medicine

## 2019-04-25 DIAGNOSIS — Z23 Encounter for immunization: Secondary | ICD-10-CM | POA: Insufficient documentation

## 2019-04-25 NOTE — Progress Notes (Signed)
   Covid-19 Vaccination Clinic  Name:  Angela Ward    MRN: [id number] DOB: 03-19-1965  04/25/2019  Ms. Spells was observed post Covid-19 immunization for 15 minutes without incident. She was provided with Vaccine Information Sheet and instruction to access the V-Safe system.   [name] was instructed to call 911 with any severe reactions post vaccine: Marland Kitchen Difficulty breathing  . Swelling of face and throat  . A fast heartbeat  . A bad rash all over body  . Dizziness and weakness   Immunizations Administered    Name Date Dose VIS Date Route   Pfizer COVID-19 Vaccine 04/25/2019  6:31 PM 0.3 mL 01/29/2019 Intramuscular   Manufacturer: [employer], [employer]   Lot: [id number]   NDC: [address]-3

## 2019-05-26 ENCOUNTER — Ambulatory Visit: Payer: Self-pay | Attending: Internal Medicine

## 2019-05-26 DIAGNOSIS — Z23 Encounter for immunization: Secondary | ICD-10-CM

## 2019-05-26 NOTE — Progress Notes (Signed)
   Covid-19 Vaccination Clinic  Name:  Angela Ward    MRN: [id number] DOB: [birth year]  05/26/2019  [name] was observed post Covid-19 immunization for 15 minutes without incident. She was provided with Vaccine Information Sheet and instruction to access the V-Safe system.   [name] was instructed to call 911 with any severe reactions post vaccine: Marland Kitchen Difficulty breathing  . Swelling of face and throat  . A fast heartbeat  . A bad rash all over body  . Dizziness and weakness   Immunizations Administered    Name Date Dose VIS Date Route   Pfizer COVID-19 Vaccine 05/26/2019  9:47 AM 0.3 mL 01/29/2019 Intramuscular   Manufacturer: [employer], [employer]   Lot: [id number]   NDC: [address]-9

## 2019-11-29 ENCOUNTER — Other Ambulatory Visit: Payer: Self-pay | Admitting: Obstetrics and Gynecology

## 2019-11-29 DIAGNOSIS — R928 Other abnormal and inconclusive findings on diagnostic imaging of breast: Secondary | ICD-10-CM

## 2019-12-06 ENCOUNTER — Other Ambulatory Visit: Payer: Self-pay | Admitting: Obstetrics and Gynecology

## 2019-12-06 ENCOUNTER — Ambulatory Visit
Admission: RE | Admit: 2019-12-06 | Discharge: 2019-12-06 | Disposition: A | Discharge status: Home / Self Care | Payer: Federal, State, Local not specified - PPO | Source: Ambulatory Visit | Attending: Obstetrics and Gynecology | Admitting: Obstetrics and Gynecology

## 2019-12-06 ENCOUNTER — Other Ambulatory Visit: Payer: Self-pay

## 2019-12-06 DIAGNOSIS — N6002 Solitary cyst of left breast: Secondary | ICD-10-CM

## 2019-12-06 DIAGNOSIS — R928 Other abnormal and inconclusive findings on diagnostic imaging of breast: Secondary | ICD-10-CM

## 2020-05-31 ENCOUNTER — Other Ambulatory Visit: Payer: Self-pay | Admitting: Family Medicine

## 2020-05-31 DIAGNOSIS — M25561 Pain in right knee: Secondary | ICD-10-CM

## 2020-06-12 ENCOUNTER — Ambulatory Visit
Admission: RE | Admit: 2020-06-12 | Discharge: 2020-06-12 | Disposition: A | Discharge status: Home / Self Care | Payer: Federal, State, Local not specified - PPO | Source: Ambulatory Visit | Attending: Family Medicine | Admitting: Family Medicine

## 2020-06-12 ENCOUNTER — Ambulatory Visit
Admission: RE | Admit: 2020-06-12 | Discharge: 2020-06-12 | Disposition: A | Discharge status: Home / Self Care | Payer: Federal, State, Local not specified - PPO | Source: Ambulatory Visit | Attending: Obstetrics and Gynecology | Admitting: Obstetrics and Gynecology

## 2020-06-12 ENCOUNTER — Other Ambulatory Visit: Payer: Self-pay

## 2020-06-12 DIAGNOSIS — M25561 Pain in right knee: Secondary | ICD-10-CM

## 2020-06-12 DIAGNOSIS — N6002 Solitary cyst of left breast: Secondary | ICD-10-CM

## 2021-03-26 DIAGNOSIS — Z713 Dietary counseling and surveillance: Secondary | ICD-10-CM | POA: Diagnosis not present

## 2021-03-26 DIAGNOSIS — Z6841 Body Mass Index (BMI) 40.0 and over, adult: Secondary | ICD-10-CM | POA: Diagnosis not present

## 2021-04-06 DIAGNOSIS — F4322 Adjustment disorder with anxiety: Secondary | ICD-10-CM | POA: Diagnosis not present

## 2021-04-06 DIAGNOSIS — R635 Abnormal weight gain: Secondary | ICD-10-CM | POA: Diagnosis not present

## 2021-04-06 DIAGNOSIS — Z6841 Body Mass Index (BMI) 40.0 and over, adult: Secondary | ICD-10-CM | POA: Diagnosis not present

## 2021-04-06 DIAGNOSIS — R739 Hyperglycemia, unspecified: Secondary | ICD-10-CM | POA: Diagnosis not present

## 2021-04-13 DIAGNOSIS — G4733 Obstructive sleep apnea (adult) (pediatric): Secondary | ICD-10-CM | POA: Diagnosis not present

## 2021-04-17 DIAGNOSIS — G4733 Obstructive sleep apnea (adult) (pediatric): Secondary | ICD-10-CM | POA: Diagnosis not present

## 2021-04-19 ENCOUNTER — Other Ambulatory Visit (HOSPITAL_COMMUNITY): Payer: Self-pay

## 2021-04-19 MED ORDER — MOUNJARO 10 MG/0.5ML ~~LOC~~ SOAJ
10.0000 mg | SUBCUTANEOUS | 0 refills | Status: DC
  Filled 2021-04-19: qty 2, 28d supply, fill #0

## 2021-04-26 ENCOUNTER — Other Ambulatory Visit (HOSPITAL_COMMUNITY): Payer: Self-pay

## 2021-04-26 DIAGNOSIS — Z713 Dietary counseling and surveillance: Secondary | ICD-10-CM | POA: Diagnosis not present

## 2021-04-26 DIAGNOSIS — R7303 Prediabetes: Secondary | ICD-10-CM | POA: Diagnosis not present

## 2021-04-26 DIAGNOSIS — M179 Osteoarthritis of knee, unspecified: Secondary | ICD-10-CM | POA: Diagnosis not present

## 2021-04-26 MED ORDER — MOUNJARO 12.5 MG/0.5ML ~~LOC~~ SOAJ
12.5000 mg | SUBCUTANEOUS | 0 refills | Status: DC
  Filled 2021-04-26 – 2021-05-15 (×4): qty 2, 28d supply, fill #0

## 2021-05-11 ENCOUNTER — Other Ambulatory Visit (HOSPITAL_BASED_OUTPATIENT_CLINIC_OR_DEPARTMENT_OTHER): Payer: Self-pay

## 2021-05-11 ENCOUNTER — Other Ambulatory Visit (HOSPITAL_COMMUNITY): Payer: Self-pay

## 2021-05-14 ENCOUNTER — Other Ambulatory Visit (HOSPITAL_COMMUNITY): Payer: Self-pay

## 2021-05-14 DIAGNOSIS — Z6841 Body Mass Index (BMI) 40.0 and over, adult: Secondary | ICD-10-CM | POA: Diagnosis not present

## 2021-05-14 DIAGNOSIS — Z713 Dietary counseling and surveillance: Secondary | ICD-10-CM | POA: Diagnosis not present

## 2021-05-15 ENCOUNTER — Other Ambulatory Visit (HOSPITAL_BASED_OUTPATIENT_CLINIC_OR_DEPARTMENT_OTHER): Payer: Self-pay

## 2021-05-15 ENCOUNTER — Other Ambulatory Visit (HOSPITAL_COMMUNITY): Payer: Self-pay

## 2021-05-16 ENCOUNTER — Other Ambulatory Visit (HOSPITAL_BASED_OUTPATIENT_CLINIC_OR_DEPARTMENT_OTHER): Payer: Self-pay

## 2021-05-16 ENCOUNTER — Other Ambulatory Visit (HOSPITAL_COMMUNITY): Payer: Self-pay

## 2021-05-16 DIAGNOSIS — G4710 Hypersomnia, unspecified: Secondary | ICD-10-CM | POA: Diagnosis not present

## 2021-05-16 DIAGNOSIS — G4733 Obstructive sleep apnea (adult) (pediatric): Secondary | ICD-10-CM | POA: Diagnosis not present

## 2021-05-16 MED ORDER — MOUNJARO 7.5 MG/0.5ML ~~LOC~~ SOAJ: SUBCUTANEOUS | 0 refills | Status: DC

## 2021-05-21 ENCOUNTER — Other Ambulatory Visit (HOSPITAL_COMMUNITY): Payer: Self-pay

## 2021-05-24 DIAGNOSIS — Z713 Dietary counseling and surveillance: Secondary | ICD-10-CM | POA: Diagnosis not present

## 2021-05-24 DIAGNOSIS — E669 Obesity, unspecified: Secondary | ICD-10-CM | POA: Diagnosis not present

## 2021-05-24 DIAGNOSIS — Z6841 Body Mass Index (BMI) 40.0 and over, adult: Secondary | ICD-10-CM | POA: Diagnosis not present

## 2021-05-24 DIAGNOSIS — R7303 Prediabetes: Secondary | ICD-10-CM | POA: Diagnosis not present

## 2021-06-07 ENCOUNTER — Other Ambulatory Visit (HOSPITAL_COMMUNITY): Payer: Self-pay

## 2021-06-07 DIAGNOSIS — M1711 Unilateral primary osteoarthritis, right knee: Secondary | ICD-10-CM | POA: Diagnosis not present

## 2021-06-07 DIAGNOSIS — M25562 Pain in left knee: Secondary | ICD-10-CM | POA: Diagnosis not present

## 2021-06-12 ENCOUNTER — Other Ambulatory Visit (HOSPITAL_COMMUNITY): Payer: Self-pay

## 2021-06-12 MED ORDER — MOUNJARO 15 MG/0.5ML ~~LOC~~ SOAJ
15.0000 mg | SUBCUTANEOUS | 2 refills | Status: DC
Start: 2021-06-07 — End: 2023-12-23
  Filled 2021-06-12: qty 2, 28d supply, fill #0
  Filled 2022-03-10 – 2022-05-13 (×3): qty 2, 28d supply, fill #1

## 2021-06-16 DIAGNOSIS — G4710 Hypersomnia, unspecified: Secondary | ICD-10-CM | POA: Diagnosis not present

## 2021-06-16 DIAGNOSIS — G4733 Obstructive sleep apnea (adult) (pediatric): Secondary | ICD-10-CM | POA: Diagnosis not present

## 2021-07-03 DIAGNOSIS — Z713 Dietary counseling and surveillance: Secondary | ICD-10-CM | POA: Diagnosis not present

## 2021-07-06 ENCOUNTER — Other Ambulatory Visit (HOSPITAL_COMMUNITY): Payer: Self-pay

## 2021-07-06 MED ORDER — MONTELUKAST SODIUM 10 MG PO TABS
10.0000 mg | ORAL_TABLET | Freq: Every evening | ORAL | 0 refills | Status: DC
  Filled 2021-07-06: qty 30, 30d supply, fill #0

## 2021-07-09 ENCOUNTER — Other Ambulatory Visit (HOSPITAL_COMMUNITY): Payer: Self-pay

## 2021-07-09 DIAGNOSIS — G4733 Obstructive sleep apnea (adult) (pediatric): Secondary | ICD-10-CM | POA: Diagnosis not present

## 2021-07-09 DIAGNOSIS — Z9989 Dependence on other enabling machines and devices: Secondary | ICD-10-CM | POA: Diagnosis not present

## 2021-07-09 MED ORDER — MOUNJARO 15 MG/0.5ML ~~LOC~~ SOAJ
15.0000 mg | SUBCUTANEOUS | 2 refills | Status: DC
  Filled 2021-07-09: qty 2, 28d supply, fill #0
  Filled 2021-08-02: qty 2, 28d supply, fill #1

## 2021-07-16 DIAGNOSIS — G4710 Hypersomnia, unspecified: Secondary | ICD-10-CM | POA: Diagnosis not present

## 2021-07-16 DIAGNOSIS — G4733 Obstructive sleep apnea (adult) (pediatric): Secondary | ICD-10-CM | POA: Diagnosis not present

## 2021-07-23 DIAGNOSIS — R7303 Prediabetes: Secondary | ICD-10-CM | POA: Diagnosis not present

## 2021-07-23 DIAGNOSIS — E559 Vitamin D deficiency, unspecified: Secondary | ICD-10-CM | POA: Diagnosis not present

## 2021-07-23 DIAGNOSIS — D508 Other iron deficiency anemias: Secondary | ICD-10-CM | POA: Diagnosis not present

## 2021-07-23 DIAGNOSIS — F4322 Adjustment disorder with anxiety: Secondary | ICD-10-CM | POA: Diagnosis not present

## 2021-07-23 DIAGNOSIS — J302 Other seasonal allergic rhinitis: Secondary | ICD-10-CM | POA: Diagnosis not present

## 2021-07-23 DIAGNOSIS — Z6841 Body Mass Index (BMI) 40.0 and over, adult: Secondary | ICD-10-CM | POA: Diagnosis not present

## 2021-08-02 ENCOUNTER — Other Ambulatory Visit (HOSPITAL_COMMUNITY): Payer: Self-pay

## 2021-08-14 DIAGNOSIS — Z713 Dietary counseling and surveillance: Secondary | ICD-10-CM | POA: Diagnosis not present

## 2021-08-16 DIAGNOSIS — G4733 Obstructive sleep apnea (adult) (pediatric): Secondary | ICD-10-CM | POA: Diagnosis not present

## 2021-08-16 DIAGNOSIS — G4710 Hypersomnia, unspecified: Secondary | ICD-10-CM | POA: Diagnosis not present

## 2021-08-28 ENCOUNTER — Other Ambulatory Visit (HOSPITAL_COMMUNITY): Payer: Self-pay

## 2021-08-29 ENCOUNTER — Other Ambulatory Visit (HOSPITAL_COMMUNITY): Payer: Self-pay

## 2021-08-29 DIAGNOSIS — F54 Psychological and behavioral factors associated with disorders or diseases classified elsewhere: Secondary | ICD-10-CM | POA: Diagnosis not present

## 2021-08-29 DIAGNOSIS — K5909 Other constipation: Secondary | ICD-10-CM | POA: Diagnosis not present

## 2021-08-29 MED ORDER — MOUNJARO 12.5 MG/0.5ML ~~LOC~~ SOAJ
12.5000 mg | SUBCUTANEOUS | 2 refills | Status: DC
  Filled 2021-08-29 – 2021-12-02 (×3): qty 2, 28d supply, fill #0

## 2021-09-05 DIAGNOSIS — G4733 Obstructive sleep apnea (adult) (pediatric): Secondary | ICD-10-CM | POA: Diagnosis not present

## 2021-09-05 DIAGNOSIS — F4322 Adjustment disorder with anxiety: Secondary | ICD-10-CM | POA: Diagnosis not present

## 2021-09-11 ENCOUNTER — Other Ambulatory Visit (HOSPITAL_COMMUNITY): Payer: Self-pay

## 2021-09-12 ENCOUNTER — Other Ambulatory Visit (HOSPITAL_COMMUNITY): Payer: Self-pay

## 2021-09-12 MED ORDER — WEGOVY 2.4 MG/0.75ML ~~LOC~~ SOAJ
2.4000 mg | SUBCUTANEOUS | 2 refills | Status: DC
  Filled 2021-09-12 – 2021-09-21 (×7): qty 3, 28d supply, fill #0
  Filled 2021-10-14 – 2021-10-16 (×2): qty 3, 28d supply, fill #1
  Filled 2021-11-10: qty 3, 28d supply, fill #2

## 2021-09-14 ENCOUNTER — Other Ambulatory Visit (HOSPITAL_COMMUNITY): Payer: Self-pay

## 2021-09-17 ENCOUNTER — Other Ambulatory Visit (HOSPITAL_COMMUNITY): Payer: Self-pay

## 2021-09-19 ENCOUNTER — Other Ambulatory Visit (HOSPITAL_COMMUNITY): Payer: Self-pay

## 2021-09-20 ENCOUNTER — Other Ambulatory Visit (HOSPITAL_COMMUNITY): Payer: Self-pay

## 2021-09-21 ENCOUNTER — Other Ambulatory Visit (HOSPITAL_COMMUNITY): Payer: Self-pay

## 2021-09-24 ENCOUNTER — Ambulatory Visit: Payer: Federal, State, Local not specified - PPO | Admitting: Family Medicine

## 2021-09-24 ENCOUNTER — Ambulatory Visit: Payer: Self-pay

## 2021-09-24 VITALS — BP 155/90 | Ht 60.0 in | Wt 250.0 lb

## 2021-09-24 DIAGNOSIS — G8929 Other chronic pain: Secondary | ICD-10-CM

## 2021-09-24 DIAGNOSIS — M25561 Pain in right knee: Secondary | ICD-10-CM | POA: Diagnosis not present

## 2021-09-24 MED ORDER — METHYLPREDNISOLONE ACETATE 40 MG/ML IJ SUSP
40.0000 mg | Freq: Once | INTRAMUSCULAR | Status: AC
Start: 2021-09-24 — End: 2021-09-24
  Administered 2021-09-24: 40 mg via INTRA_ARTICULAR

## 2021-09-24 NOTE — Progress Notes (Unsigned)
PCP: Angelica Chessman, MD  Subjective:   HPI: Patient is a 56 y.o. female here for right knee pain.  History of right knee arthritis. Pain with walking and stairs. She is seen at [facility] and is referred here today for an ultrasound guided knee injection. She has received several knee injections before, which give her relief for about 2 months, longer when ultrasound guided.   No past medical history on file.  Current Outpatient Medications on File Prior to Visit  Medication Sig Dispense Refill   montelukast (SINGULAIR) 10 MG tablet Take 1 tablet (10 mg total) by mouth nightly 30 tablet 0   Semaglutide-Weight Management (WEGOVY) 2.4 MG/0.75ML SOAJ Inject 2.4 mg into the skin every 7 days 3 mL 2   tirzepatide (MOUNJARO) 10 MG/0.5ML Pen Inject 10 mg into the skin once a week. 2 mL 0   tirzepatide (MOUNJARO) 12.5 MG/0.5ML Pen Inject 12.5 mg into the skin once a week. 2 mL 2   tirzepatide (MOUNJARO) 15 MG/0.5ML Pen Inject 15 mg into the skin every 7 (seven) days. 2 mL 2   tirzepatide (MOUNJARO) 15 MG/0.5ML Pen Inject 15 mg into the skin once a week. 2 mL 2   No current facility-administered medications on file prior to visit.    No past surgical history on file.  No Known Allergies  BP (!) 155/90   Ht 5' (1.524 m)   Wt 250 lb (113.4 kg)   LMP 02/23/2014   BMI 48.82 kg/m       No data to display              No data to display              Objective:  Physical Exam:  Gen: NAD, comfortable in exam room Knee, right: Inspection was negative for erythema, ecchymosis, and effusion. No obvious bony abnormalities or signs of osteophyte development. No joint line tenderness; No condyle tenderness; No patellar tenderness; No knee crepitus. Patellar and quadriceps tendons unremarkable.     Assessment & Plan:  1. Right knee pain 2/2 arthritis. Right knee injection performed today under ultrasound guidance. Continue to follow with Delbert Harness.   After informed written  consent timeout was performed, patient was laying on exam table. Right knee was prepped with alcohol swab and utilizing superolateral approach under ultrasound guidance, patient's right knee was injected intraarticularly with 3:1 lidocaine: depomedrol. Patient tolerated the procedure well without immediate complications.  Timoteo Expose, MS4

## 2021-09-25 ENCOUNTER — Encounter: Payer: Self-pay | Admitting: Family Medicine

## 2021-10-15 ENCOUNTER — Other Ambulatory Visit (HOSPITAL_COMMUNITY): Payer: Self-pay

## 2021-10-16 ENCOUNTER — Other Ambulatory Visit (HOSPITAL_COMMUNITY): Payer: Self-pay

## 2021-10-16 DIAGNOSIS — M1711 Unilateral primary osteoarthritis, right knee: Secondary | ICD-10-CM | POA: Diagnosis not present

## 2021-10-23 DIAGNOSIS — M1711 Unilateral primary osteoarthritis, right knee: Secondary | ICD-10-CM | POA: Diagnosis not present

## 2021-10-29 DIAGNOSIS — Z713 Dietary counseling and surveillance: Secondary | ICD-10-CM | POA: Diagnosis not present

## 2021-10-30 DIAGNOSIS — M1711 Unilateral primary osteoarthritis, right knee: Secondary | ICD-10-CM | POA: Diagnosis not present

## 2021-11-03 ENCOUNTER — Other Ambulatory Visit (HOSPITAL_COMMUNITY): Payer: Self-pay

## 2021-11-03 MED ORDER — PHENTERMINE HCL 37.5 MG PO CAPS
37.5000 mg | ORAL_CAPSULE | Freq: Every morning | ORAL | 0 refills | Status: DC
  Filled 2021-11-03: qty 30, 30d supply, fill #0

## 2021-11-10 ENCOUNTER — Other Ambulatory Visit (HOSPITAL_COMMUNITY): Payer: Self-pay

## 2021-11-27 DIAGNOSIS — R7303 Prediabetes: Secondary | ICD-10-CM | POA: Diagnosis not present

## 2021-11-27 DIAGNOSIS — K5900 Constipation, unspecified: Secondary | ICD-10-CM | POA: Diagnosis not present

## 2021-11-27 DIAGNOSIS — Z6841 Body Mass Index (BMI) 40.0 and over, adult: Secondary | ICD-10-CM | POA: Diagnosis not present

## 2021-12-01 ENCOUNTER — Other Ambulatory Visit (HOSPITAL_COMMUNITY): Payer: Self-pay

## 2021-12-03 ENCOUNTER — Other Ambulatory Visit (HOSPITAL_COMMUNITY): Payer: Self-pay

## 2021-12-03 MED ORDER — PHENTERMINE HCL 37.5 MG PO CAPS
37.5000 mg | ORAL_CAPSULE | Freq: Every morning | ORAL | 0 refills | Status: DC
  Filled 2021-12-03: qty 30, 30d supply, fill #0

## 2021-12-10 DIAGNOSIS — G4733 Obstructive sleep apnea (adult) (pediatric): Secondary | ICD-10-CM | POA: Diagnosis not present

## 2021-12-10 DIAGNOSIS — F4322 Adjustment disorder with anxiety: Secondary | ICD-10-CM | POA: Diagnosis not present

## 2021-12-12 ENCOUNTER — Other Ambulatory Visit (HOSPITAL_COMMUNITY): Payer: Self-pay

## 2021-12-13 ENCOUNTER — Other Ambulatory Visit (HOSPITAL_COMMUNITY): Payer: Self-pay

## 2021-12-13 MED ORDER — WEGOVY 2.4 MG/0.75ML ~~LOC~~ SOAJ
SUBCUTANEOUS | 2 refills | Status: DC
  Filled 2021-12-13: qty 3, 28d supply, fill #0
  Filled 2022-01-06: qty 3, 28d supply, fill #1
  Filled 2022-02-04: qty 3, 28d supply, fill #2

## 2021-12-31 ENCOUNTER — Other Ambulatory Visit (HOSPITAL_COMMUNITY): Payer: Self-pay

## 2021-12-31 MED ORDER — PHENTERMINE HCL 37.5 MG PO CAPS
37.5000 mg | ORAL_CAPSULE | Freq: Every morning | ORAL | 1 refills | Status: DC
  Filled 2022-01-03: qty 30, 30d supply, fill #0
  Filled 2022-02-04: qty 30, 30d supply, fill #1

## 2021-12-31 MED ORDER — BUPROPION HCL ER (XL) 150 MG PO TB24
ORAL_TABLET | ORAL | 2 refills | Status: DC
  Filled 2021-12-31: qty 60, 33d supply, fill #0
  Filled 2022-02-04: qty 60, 30d supply, fill #1
  Filled 2022-03-23: qty 60, 30d supply, fill #2

## 2022-01-03 ENCOUNTER — Other Ambulatory Visit (HOSPITAL_COMMUNITY): Payer: Self-pay

## 2022-01-07 ENCOUNTER — Other Ambulatory Visit (HOSPITAL_COMMUNITY): Payer: Self-pay

## 2022-01-08 DIAGNOSIS — Z1231 Encounter for screening mammogram for malignant neoplasm of breast: Secondary | ICD-10-CM | POA: Diagnosis not present

## 2022-01-08 DIAGNOSIS — Z124 Encounter for screening for malignant neoplasm of cervix: Secondary | ICD-10-CM | POA: Diagnosis not present

## 2022-01-08 DIAGNOSIS — Z01419 Encounter for gynecological examination (general) (routine) without abnormal findings: Secondary | ICD-10-CM | POA: Diagnosis not present

## 2022-01-12 ENCOUNTER — Other Ambulatory Visit (HOSPITAL_COMMUNITY): Payer: Self-pay

## 2022-01-21 DIAGNOSIS — Z6841 Body Mass Index (BMI) 40.0 and over, adult: Secondary | ICD-10-CM | POA: Diagnosis not present

## 2022-01-21 DIAGNOSIS — Z713 Dietary counseling and surveillance: Secondary | ICD-10-CM | POA: Diagnosis not present

## 2022-02-01 DIAGNOSIS — R0981 Nasal congestion: Secondary | ICD-10-CM | POA: Diagnosis not present

## 2022-02-01 DIAGNOSIS — J029 Acute pharyngitis, unspecified: Secondary | ICD-10-CM | POA: Diagnosis not present

## 2022-02-01 DIAGNOSIS — R059 Cough, unspecified: Secondary | ICD-10-CM | POA: Diagnosis not present

## 2022-02-01 DIAGNOSIS — R0982 Postnasal drip: Secondary | ICD-10-CM | POA: Diagnosis not present

## 2022-02-04 ENCOUNTER — Other Ambulatory Visit: Payer: Self-pay

## 2022-02-05 ENCOUNTER — Other Ambulatory Visit: Payer: Self-pay

## 2022-02-17 ENCOUNTER — Other Ambulatory Visit (HOSPITAL_COMMUNITY): Payer: Self-pay

## 2022-02-19 ENCOUNTER — Other Ambulatory Visit (HOSPITAL_COMMUNITY): Payer: Self-pay

## 2022-02-19 MED ORDER — MONTELUKAST SODIUM 10 MG PO TABS
10.0000 mg | ORAL_TABLET | Freq: Every evening | ORAL | 0 refills | Status: DC
  Filled 2022-02-19: qty 90, 90d supply, fill #0

## 2022-02-21 ENCOUNTER — Other Ambulatory Visit (HOSPITAL_COMMUNITY): Payer: Self-pay

## 2022-03-05 ENCOUNTER — Other Ambulatory Visit (HOSPITAL_COMMUNITY): Payer: Self-pay

## 2022-03-05 DIAGNOSIS — R7303 Prediabetes: Secondary | ICD-10-CM | POA: Diagnosis not present

## 2022-03-05 DIAGNOSIS — M179 Osteoarthritis of knee, unspecified: Secondary | ICD-10-CM | POA: Diagnosis not present

## 2022-03-05 DIAGNOSIS — D509 Iron deficiency anemia, unspecified: Secondary | ICD-10-CM | POA: Diagnosis not present

## 2022-03-05 DIAGNOSIS — F4322 Adjustment disorder with anxiety: Secondary | ICD-10-CM | POA: Diagnosis not present

## 2022-03-05 MED ORDER — ZEPBOUND 10 MG/0.5ML ~~LOC~~ SOAJ
10.0000 mg | SUBCUTANEOUS | 2 refills | Status: DC
  Filled 2022-03-05 – 2022-07-02 (×5): qty 2, 28d supply, fill #0

## 2022-03-05 MED ORDER — PHENTERMINE HCL 37.5 MG PO CAPS
37.5000 mg | ORAL_CAPSULE | Freq: Every morning | ORAL | 1 refills | Status: DC
  Filled 2022-03-05 – 2022-03-23 (×2): qty 30, 30d supply, fill #0
  Filled 2022-05-15: qty 30, 30d supply, fill #1

## 2022-03-07 ENCOUNTER — Other Ambulatory Visit (HOSPITAL_COMMUNITY): Payer: Self-pay

## 2022-03-14 ENCOUNTER — Other Ambulatory Visit (HOSPITAL_COMMUNITY): Payer: Self-pay

## 2022-03-19 ENCOUNTER — Other Ambulatory Visit (HOSPITAL_COMMUNITY): Payer: Self-pay

## 2022-03-21 ENCOUNTER — Other Ambulatory Visit: Payer: Self-pay

## 2022-03-21 ENCOUNTER — Other Ambulatory Visit (HOSPITAL_COMMUNITY): Payer: Self-pay

## 2022-03-22 ENCOUNTER — Other Ambulatory Visit (HOSPITAL_COMMUNITY): Payer: Self-pay

## 2022-03-25 ENCOUNTER — Other Ambulatory Visit (HOSPITAL_COMMUNITY): Payer: Self-pay

## 2022-03-27 ENCOUNTER — Other Ambulatory Visit (HOSPITAL_COMMUNITY): Payer: Self-pay

## 2022-04-08 DIAGNOSIS — Z1389 Encounter for screening for other disorder: Secondary | ICD-10-CM | POA: Diagnosis not present

## 2022-04-08 DIAGNOSIS — R7303 Prediabetes: Secondary | ICD-10-CM | POA: Diagnosis not present

## 2022-04-08 DIAGNOSIS — D509 Iron deficiency anemia, unspecified: Secondary | ICD-10-CM | POA: Diagnosis not present

## 2022-04-08 DIAGNOSIS — E8889 Other specified metabolic disorders: Secondary | ICD-10-CM | POA: Diagnosis not present

## 2022-04-09 ENCOUNTER — Other Ambulatory Visit (HOSPITAL_COMMUNITY): Payer: Self-pay

## 2022-04-09 MED ORDER — ZEPBOUND 7.5 MG/0.5ML ~~LOC~~ SOAJ
7.5000 mg | SUBCUTANEOUS | 0 refills | Status: DC
  Filled 2022-04-09: qty 2, 28d supply, fill #0

## 2022-04-10 ENCOUNTER — Other Ambulatory Visit (HOSPITAL_COMMUNITY): Payer: Self-pay

## 2022-04-10 ENCOUNTER — Other Ambulatory Visit: Payer: Self-pay

## 2022-04-11 ENCOUNTER — Other Ambulatory Visit (HOSPITAL_COMMUNITY): Payer: Self-pay

## 2022-04-13 ENCOUNTER — Other Ambulatory Visit (HOSPITAL_COMMUNITY): Payer: Self-pay

## 2022-04-15 ENCOUNTER — Other Ambulatory Visit (HOSPITAL_COMMUNITY): Payer: Self-pay

## 2022-04-15 MED ORDER — WEGOVY 2.4 MG/0.75ML ~~LOC~~ SOAJ
2.4000 mg | SUBCUTANEOUS | 1 refills | Status: DC
  Filled 2022-04-15 – 2022-10-18 (×4): qty 3, 28d supply, fill #0

## 2022-04-18 ENCOUNTER — Other Ambulatory Visit (HOSPITAL_COMMUNITY): Payer: Self-pay

## 2022-04-19 ENCOUNTER — Other Ambulatory Visit (HOSPITAL_COMMUNITY): Payer: Self-pay

## 2022-04-19 MED ORDER — MELOXICAM 15 MG PO TABS
15.0000 mg | ORAL_TABLET | Freq: Every day | ORAL | 1 refills | Status: AC
  Filled 2022-04-19: qty 90, 90d supply, fill #0

## 2022-04-19 MED ORDER — SERTRALINE HCL 100 MG PO TABS
150.0000 mg | ORAL_TABLET | Freq: Every day | ORAL | 1 refills | Status: AC
  Filled 2022-04-19: qty 135, 90d supply, fill #0

## 2022-04-19 MED ORDER — SEMAGLUTIDE-WEIGHT MANAGEMENT 2.4 MG/0.75ML ~~LOC~~ SOAJ
2.4000 mg | SUBCUTANEOUS | 6 refills | Status: DC
  Filled 2022-04-19: qty 3, 28d supply, fill #0
  Filled 2022-05-15: qty 3, 28d supply, fill #1

## 2022-04-22 ENCOUNTER — Other Ambulatory Visit (HOSPITAL_COMMUNITY): Payer: Self-pay

## 2022-04-22 DIAGNOSIS — R7303 Prediabetes: Secondary | ICD-10-CM | POA: Diagnosis not present

## 2022-04-22 MED ORDER — TOPIRAMATE ER 50 MG PO CAP24
50.0000 mg | ORAL_CAPSULE | Freq: Every day | ORAL | 3 refills | Status: DC
  Filled 2022-04-22: qty 30, 30d supply, fill #0
  Filled 2022-06-13: qty 30, 30d supply, fill #1
  Filled 2022-12-03: qty 30, 30d supply, fill #2
  Filled 2023-03-18 – 2023-03-19 (×2): qty 30, 30d supply, fill #3

## 2022-04-22 MED ORDER — PHENTERMINE HCL 37.5 MG PO TABS
18.2500 mg | ORAL_TABLET | Freq: Every day | ORAL | 0 refills | Status: DC
  Filled 2022-04-22: qty 30, 30d supply, fill #0

## 2022-04-22 MED ORDER — WEGOVY 2.4 MG/0.75ML ~~LOC~~ SOAJ
2.4000 mg | SUBCUTANEOUS | 1 refills | Status: DC
  Filled 2022-04-22 – 2022-06-13 (×2): qty 3, 28d supply, fill #0

## 2022-04-23 ENCOUNTER — Other Ambulatory Visit: Payer: Self-pay

## 2022-04-23 ENCOUNTER — Other Ambulatory Visit (HOSPITAL_COMMUNITY): Payer: Self-pay

## 2022-04-24 ENCOUNTER — Other Ambulatory Visit (HOSPITAL_COMMUNITY): Payer: Self-pay

## 2022-04-25 ENCOUNTER — Other Ambulatory Visit (HOSPITAL_COMMUNITY): Payer: Self-pay

## 2022-04-27 ENCOUNTER — Other Ambulatory Visit (HOSPITAL_COMMUNITY): Payer: Self-pay

## 2022-04-29 ENCOUNTER — Other Ambulatory Visit (HOSPITAL_COMMUNITY): Payer: Self-pay

## 2022-05-06 ENCOUNTER — Other Ambulatory Visit (HOSPITAL_COMMUNITY): Payer: Self-pay

## 2022-05-06 DIAGNOSIS — R7303 Prediabetes: Secondary | ICD-10-CM | POA: Diagnosis not present

## 2022-05-06 MED ORDER — PHENTERMINE HCL 37.5 MG PO TABS: 18.7500 mg | ORAL_TABLET | Freq: Every day | ORAL | 0 refills | Status: DC

## 2022-05-06 MED ORDER — PHENTERMINE HCL 37.5 MG PO CAPS
37.5000 mg | ORAL_CAPSULE | Freq: Every day | ORAL | 0 refills | Status: DC
  Filled 2022-05-06: qty 30, 30d supply, fill #0
  Filled 2022-05-15: qty 30, 29d supply, fill #0
  Filled 2022-05-15: qty 30, 30d supply, fill #0

## 2022-05-14 ENCOUNTER — Other Ambulatory Visit (HOSPITAL_COMMUNITY): Payer: Self-pay

## 2022-05-15 ENCOUNTER — Other Ambulatory Visit (HOSPITAL_COMMUNITY): Payer: Self-pay

## 2022-05-15 MED ORDER — BUPROPION HCL ER (XL) 300 MG PO TB24
300.0000 mg | ORAL_TABLET | ORAL | 0 refills | Status: DC
  Filled 2022-05-15: qty 1, 1d supply, fill #0
  Filled 2022-05-17: qty 30, 30d supply, fill #0

## 2022-05-16 ENCOUNTER — Other Ambulatory Visit (HOSPITAL_COMMUNITY): Payer: Self-pay

## 2022-05-17 ENCOUNTER — Other Ambulatory Visit (HOSPITAL_COMMUNITY): Payer: Self-pay

## 2022-05-17 MED ORDER — MONTELUKAST SODIUM 10 MG PO TABS
10.0000 mg | ORAL_TABLET | Freq: Every evening | ORAL | 0 refills | Status: DC
  Filled 2022-05-17: qty 90, 90d supply, fill #0

## 2022-05-21 ENCOUNTER — Other Ambulatory Visit (HOSPITAL_COMMUNITY): Payer: Self-pay

## 2022-05-23 ENCOUNTER — Other Ambulatory Visit: Payer: Self-pay

## 2022-05-29 ENCOUNTER — Other Ambulatory Visit (HOSPITAL_COMMUNITY): Payer: Self-pay

## 2022-05-29 DIAGNOSIS — Z6841 Body Mass Index (BMI) 40.0 and over, adult: Secondary | ICD-10-CM | POA: Diagnosis not present

## 2022-05-29 DIAGNOSIS — E65 Localized adiposity: Secondary | ICD-10-CM | POA: Diagnosis not present

## 2022-05-29 DIAGNOSIS — R7303 Prediabetes: Secondary | ICD-10-CM | POA: Diagnosis not present

## 2022-05-29 MED ORDER — ZEPBOUND 10 MG/0.5ML ~~LOC~~ SOAJ
10.0000 mg | SUBCUTANEOUS | 1 refills | Status: DC
  Filled 2022-05-29 – 2023-05-23 (×3): qty 2, 28d supply, fill #0

## 2022-05-29 MED ORDER — PHENTERMINE HCL 37.5 MG PO CAPS
37.5000 mg | ORAL_CAPSULE | Freq: Every day | ORAL | 0 refills | Status: DC
  Filled 2022-05-29: qty 30, 30d supply, fill #0

## 2022-05-31 DIAGNOSIS — J209 Acute bronchitis, unspecified: Secondary | ICD-10-CM | POA: Diagnosis not present

## 2022-06-11 ENCOUNTER — Other Ambulatory Visit (HOSPITAL_COMMUNITY): Payer: Self-pay

## 2022-06-13 ENCOUNTER — Other Ambulatory Visit (HOSPITAL_COMMUNITY): Payer: Self-pay

## 2022-06-17 ENCOUNTER — Other Ambulatory Visit (HOSPITAL_COMMUNITY): Payer: Self-pay

## 2022-07-01 ENCOUNTER — Other Ambulatory Visit (HOSPITAL_COMMUNITY): Payer: Self-pay

## 2022-07-01 DIAGNOSIS — Z9189 Other specified personal risk factors, not elsewhere classified: Secondary | ICD-10-CM | POA: Diagnosis not present

## 2022-07-01 DIAGNOSIS — E611 Iron deficiency: Secondary | ICD-10-CM | POA: Diagnosis not present

## 2022-07-01 DIAGNOSIS — E559 Vitamin D deficiency, unspecified: Secondary | ICD-10-CM | POA: Diagnosis not present

## 2022-07-01 DIAGNOSIS — R7303 Prediabetes: Secondary | ICD-10-CM | POA: Diagnosis not present

## 2022-07-01 DIAGNOSIS — E65 Localized adiposity: Secondary | ICD-10-CM | POA: Diagnosis not present

## 2022-07-02 ENCOUNTER — Other Ambulatory Visit (HOSPITAL_COMMUNITY): Payer: Self-pay

## 2022-07-03 ENCOUNTER — Other Ambulatory Visit (HOSPITAL_COMMUNITY): Payer: Self-pay

## 2022-07-03 ENCOUNTER — Other Ambulatory Visit (HOSPITAL_BASED_OUTPATIENT_CLINIC_OR_DEPARTMENT_OTHER): Payer: Self-pay

## 2022-07-03 ENCOUNTER — Other Ambulatory Visit: Payer: Self-pay

## 2022-07-04 ENCOUNTER — Other Ambulatory Visit (HOSPITAL_COMMUNITY): Payer: Self-pay

## 2022-07-04 MED ORDER — BUPROPION HCL ER (XL) 300 MG PO TB24
ORAL_TABLET | ORAL | 0 refills | Status: DC
  Filled 2022-07-04: qty 30, 30d supply, fill #0

## 2022-07-06 ENCOUNTER — Other Ambulatory Visit (HOSPITAL_COMMUNITY): Payer: Self-pay

## 2022-07-08 ENCOUNTER — Other Ambulatory Visit (HOSPITAL_COMMUNITY): Payer: Self-pay

## 2022-07-09 DIAGNOSIS — G4733 Obstructive sleep apnea (adult) (pediatric): Secondary | ICD-10-CM | POA: Diagnosis not present

## 2022-07-09 DIAGNOSIS — Z9989 Dependence on other enabling machines and devices: Secondary | ICD-10-CM | POA: Diagnosis not present

## 2022-07-19 DIAGNOSIS — M1711 Unilateral primary osteoarthritis, right knee: Secondary | ICD-10-CM | POA: Diagnosis not present

## 2022-07-22 ENCOUNTER — Other Ambulatory Visit (HOSPITAL_COMMUNITY): Payer: Self-pay

## 2022-07-22 DIAGNOSIS — E65 Localized adiposity: Secondary | ICD-10-CM | POA: Diagnosis not present

## 2022-07-22 DIAGNOSIS — E559 Vitamin D deficiency, unspecified: Secondary | ICD-10-CM | POA: Diagnosis not present

## 2022-07-22 DIAGNOSIS — R7303 Prediabetes: Secondary | ICD-10-CM | POA: Diagnosis not present

## 2022-07-22 DIAGNOSIS — E611 Iron deficiency: Secondary | ICD-10-CM | POA: Diagnosis not present

## 2022-07-22 MED ORDER — ZEPBOUND 12.5 MG/0.5ML ~~LOC~~ SOAJ
12.5000 mg | SUBCUTANEOUS | 1 refills | Status: DC
  Filled 2022-07-22 – 2022-08-13 (×2): qty 2, 28d supply, fill #0

## 2022-07-22 MED ORDER — PHENTERMINE HCL 37.5 MG PO CAPS
37.5000 mg | ORAL_CAPSULE | Freq: Every day | ORAL | 0 refills | Status: DC
  Filled 2022-07-22 – 2022-08-08 (×2): qty 30, 30d supply, fill #0

## 2022-07-25 ENCOUNTER — Other Ambulatory Visit (HOSPITAL_COMMUNITY): Payer: Self-pay

## 2022-07-29 DIAGNOSIS — G4733 Obstructive sleep apnea (adult) (pediatric): Secondary | ICD-10-CM | POA: Diagnosis not present

## 2022-07-29 DIAGNOSIS — F4322 Adjustment disorder with anxiety: Secondary | ICD-10-CM | POA: Diagnosis not present

## 2022-08-02 ENCOUNTER — Other Ambulatory Visit (HOSPITAL_COMMUNITY): Payer: Self-pay

## 2022-08-08 ENCOUNTER — Other Ambulatory Visit (HOSPITAL_COMMUNITY): Payer: Self-pay

## 2022-08-08 ENCOUNTER — Other Ambulatory Visit: Payer: Self-pay

## 2022-08-10 ENCOUNTER — Other Ambulatory Visit (HOSPITAL_COMMUNITY): Payer: Self-pay

## 2022-08-13 ENCOUNTER — Other Ambulatory Visit (HOSPITAL_COMMUNITY): Payer: Self-pay

## 2022-08-14 ENCOUNTER — Other Ambulatory Visit (HOSPITAL_COMMUNITY): Payer: Self-pay

## 2022-08-15 ENCOUNTER — Other Ambulatory Visit (HOSPITAL_COMMUNITY): Payer: Self-pay

## 2022-08-19 ENCOUNTER — Other Ambulatory Visit (HOSPITAL_COMMUNITY): Payer: Self-pay

## 2022-08-19 DIAGNOSIS — E611 Iron deficiency: Secondary | ICD-10-CM | POA: Diagnosis not present

## 2022-08-19 DIAGNOSIS — E559 Vitamin D deficiency, unspecified: Secondary | ICD-10-CM | POA: Diagnosis not present

## 2022-08-19 DIAGNOSIS — E65 Localized adiposity: Secondary | ICD-10-CM | POA: Diagnosis not present

## 2022-08-19 DIAGNOSIS — R7303 Prediabetes: Secondary | ICD-10-CM | POA: Diagnosis not present

## 2022-08-19 MED ORDER — PHENTERMINE HCL 37.5 MG PO CAPS
37.5000 mg | ORAL_CAPSULE | Freq: Every day | ORAL | 0 refills | Status: DC
  Filled 2022-09-25: qty 30, 30d supply, fill #0

## 2022-08-19 MED ORDER — BUPROPION HCL ER (XL) 300 MG PO TB24
300.0000 mg | ORAL_TABLET | Freq: Every morning | ORAL | 0 refills | Status: DC
  Filled 2022-08-19: qty 30, 30d supply, fill #0

## 2022-08-20 ENCOUNTER — Other Ambulatory Visit (HOSPITAL_COMMUNITY): Payer: Self-pay

## 2022-08-21 ENCOUNTER — Other Ambulatory Visit (HOSPITAL_COMMUNITY): Payer: Self-pay

## 2022-08-27 DIAGNOSIS — Z23 Encounter for immunization: Secondary | ICD-10-CM | POA: Diagnosis not present

## 2022-08-27 DIAGNOSIS — F4322 Adjustment disorder with anxiety: Secondary | ICD-10-CM | POA: Diagnosis not present

## 2022-08-27 DIAGNOSIS — J302 Other seasonal allergic rhinitis: Secondary | ICD-10-CM | POA: Diagnosis not present

## 2022-08-27 DIAGNOSIS — Z6841 Body Mass Index (BMI) 40.0 and over, adult: Secondary | ICD-10-CM | POA: Diagnosis not present

## 2022-09-17 ENCOUNTER — Other Ambulatory Visit: Payer: Self-pay

## 2022-09-17 ENCOUNTER — Other Ambulatory Visit (HOSPITAL_COMMUNITY): Payer: Self-pay

## 2022-09-17 DIAGNOSIS — Z6841 Body Mass Index (BMI) 40.0 and over, adult: Secondary | ICD-10-CM | POA: Diagnosis not present

## 2022-09-17 DIAGNOSIS — R7303 Prediabetes: Secondary | ICD-10-CM | POA: Diagnosis not present

## 2022-09-17 DIAGNOSIS — E65 Localized adiposity: Secondary | ICD-10-CM | POA: Diagnosis not present

## 2022-09-17 DIAGNOSIS — N760 Acute vaginitis: Secondary | ICD-10-CM | POA: Diagnosis not present

## 2022-09-17 DIAGNOSIS — E611 Iron deficiency: Secondary | ICD-10-CM | POA: Diagnosis not present

## 2022-09-17 DIAGNOSIS — E559 Vitamin D deficiency, unspecified: Secondary | ICD-10-CM | POA: Diagnosis not present

## 2022-09-17 MED ORDER — ZEPBOUND 12.5 MG/0.5ML ~~LOC~~ SOAJ
12.5000 mg | SUBCUTANEOUS | 1 refills | Status: DC
  Filled 2022-09-17: qty 2, 28d supply, fill #0

## 2022-09-17 MED ORDER — BUPROPION HCL ER (XL) 300 MG PO TB24
300.0000 mg | ORAL_TABLET | Freq: Every morning | ORAL | 2 refills | Status: DC
  Filled 2022-09-17: qty 30, 30d supply, fill #0
  Filled 2022-10-18: qty 30, 30d supply, fill #1
  Filled 2022-12-03: qty 30, 30d supply, fill #2

## 2022-09-17 MED ORDER — PHENTERMINE HCL 37.5 MG PO CAPS
37.5000 mg | ORAL_CAPSULE | Freq: Every day | ORAL | 0 refills | Status: DC
  Filled 2022-09-17 – 2022-12-03 (×3): qty 30, 30d supply, fill #0

## 2022-09-19 ENCOUNTER — Other Ambulatory Visit (HOSPITAL_COMMUNITY): Payer: Self-pay

## 2022-09-23 ENCOUNTER — Other Ambulatory Visit (HOSPITAL_COMMUNITY): Payer: Self-pay

## 2022-09-25 ENCOUNTER — Other Ambulatory Visit (HOSPITAL_COMMUNITY): Payer: Self-pay

## 2022-09-27 ENCOUNTER — Other Ambulatory Visit (HOSPITAL_COMMUNITY): Payer: Self-pay

## 2022-10-18 ENCOUNTER — Other Ambulatory Visit: Payer: Self-pay

## 2022-10-18 ENCOUNTER — Other Ambulatory Visit (HOSPITAL_COMMUNITY): Payer: Self-pay

## 2022-10-19 ENCOUNTER — Other Ambulatory Visit: Payer: Self-pay

## 2022-10-22 ENCOUNTER — Other Ambulatory Visit: Payer: Self-pay

## 2022-10-22 ENCOUNTER — Other Ambulatory Visit (HOSPITAL_COMMUNITY): Payer: Self-pay

## 2022-10-31 ENCOUNTER — Other Ambulatory Visit (HOSPITAL_COMMUNITY): Payer: Self-pay

## 2022-10-31 DIAGNOSIS — R7303 Prediabetes: Secondary | ICD-10-CM | POA: Diagnosis not present

## 2022-10-31 DIAGNOSIS — E65 Localized adiposity: Secondary | ICD-10-CM | POA: Diagnosis not present

## 2022-10-31 DIAGNOSIS — E611 Iron deficiency: Secondary | ICD-10-CM | POA: Diagnosis not present

## 2022-10-31 DIAGNOSIS — E559 Vitamin D deficiency, unspecified: Secondary | ICD-10-CM | POA: Diagnosis not present

## 2022-10-31 MED ORDER — PHENTERMINE HCL 37.5 MG PO CAPS
37.5000 mg | ORAL_CAPSULE | Freq: Every day | ORAL | 0 refills | Status: DC
  Filled 2022-10-31: qty 30, 30d supply, fill #0

## 2022-10-31 MED ORDER — ZEPBOUND 15 MG/0.5ML ~~LOC~~ SOAJ
SUBCUTANEOUS | 1 refills | Status: DC
  Filled 2022-10-31: qty 2, 30d supply, fill #0
  Filled 2023-02-06: qty 2, 30d supply, fill #1

## 2022-11-04 ENCOUNTER — Other Ambulatory Visit (HOSPITAL_COMMUNITY): Payer: Self-pay

## 2022-11-04 DIAGNOSIS — F4322 Adjustment disorder with anxiety: Secondary | ICD-10-CM | POA: Diagnosis not present

## 2022-11-04 DIAGNOSIS — G4733 Obstructive sleep apnea (adult) (pediatric): Secondary | ICD-10-CM | POA: Diagnosis not present

## 2022-12-03 ENCOUNTER — Other Ambulatory Visit (HOSPITAL_COMMUNITY): Payer: Self-pay

## 2022-12-03 ENCOUNTER — Other Ambulatory Visit: Payer: Self-pay

## 2022-12-06 ENCOUNTER — Other Ambulatory Visit (HOSPITAL_COMMUNITY): Payer: Self-pay

## 2022-12-09 ENCOUNTER — Other Ambulatory Visit (HOSPITAL_COMMUNITY): Payer: Self-pay

## 2022-12-16 ENCOUNTER — Other Ambulatory Visit (HOSPITAL_COMMUNITY): Payer: Self-pay

## 2022-12-16 DIAGNOSIS — E611 Iron deficiency: Secondary | ICD-10-CM | POA: Diagnosis not present

## 2022-12-16 DIAGNOSIS — E65 Localized adiposity: Secondary | ICD-10-CM | POA: Diagnosis not present

## 2022-12-16 DIAGNOSIS — Z6841 Body Mass Index (BMI) 40.0 and over, adult: Secondary | ICD-10-CM | POA: Diagnosis not present

## 2022-12-16 DIAGNOSIS — Z79899 Other long term (current) drug therapy: Secondary | ICD-10-CM | POA: Diagnosis not present

## 2022-12-16 DIAGNOSIS — R7303 Prediabetes: Secondary | ICD-10-CM | POA: Diagnosis not present

## 2022-12-16 DIAGNOSIS — E559 Vitamin D deficiency, unspecified: Secondary | ICD-10-CM | POA: Diagnosis not present

## 2022-12-16 MED ORDER — PHENTERMINE HCL 37.5 MG PO CAPS
37.5000 mg | ORAL_CAPSULE | Freq: Every day | ORAL | 0 refills | Status: DC
  Filled 2022-12-16 – 2023-05-23 (×2): qty 30, 30d supply, fill #0

## 2022-12-16 MED ORDER — ZEPBOUND 15 MG/0.5ML ~~LOC~~ SOAJ
15.0000 mg | SUBCUTANEOUS | 1 refills | Status: DC
  Filled 2022-12-16: qty 2, 28d supply, fill #0
  Filled 2023-08-28: qty 2, 28d supply, fill #1

## 2022-12-24 ENCOUNTER — Other Ambulatory Visit (HOSPITAL_COMMUNITY): Payer: Self-pay

## 2023-01-22 DIAGNOSIS — N760 Acute vaginitis: Secondary | ICD-10-CM | POA: Diagnosis not present

## 2023-01-22 DIAGNOSIS — Z01419 Encounter for gynecological examination (general) (routine) without abnormal findings: Secondary | ICD-10-CM | POA: Diagnosis not present

## 2023-01-22 DIAGNOSIS — Z124 Encounter for screening for malignant neoplasm of cervix: Secondary | ICD-10-CM | POA: Diagnosis not present

## 2023-01-22 DIAGNOSIS — Z1231 Encounter for screening mammogram for malignant neoplasm of breast: Secondary | ICD-10-CM | POA: Diagnosis not present

## 2023-01-23 ENCOUNTER — Other Ambulatory Visit (HOSPITAL_COMMUNITY): Payer: Self-pay

## 2023-01-23 DIAGNOSIS — R7303 Prediabetes: Secondary | ICD-10-CM | POA: Diagnosis not present

## 2023-01-23 DIAGNOSIS — E559 Vitamin D deficiency, unspecified: Secondary | ICD-10-CM | POA: Diagnosis not present

## 2023-01-23 DIAGNOSIS — E65 Localized adiposity: Secondary | ICD-10-CM | POA: Diagnosis not present

## 2023-01-23 DIAGNOSIS — E611 Iron deficiency: Secondary | ICD-10-CM | POA: Diagnosis not present

## 2023-01-23 MED ORDER — PHENTERMINE HCL 37.5 MG PO CAPS
37.5000 mg | ORAL_CAPSULE | Freq: Every day | ORAL | 1 refills | Status: DC
  Filled 2023-01-23: qty 30, 30d supply, fill #0
  Filled 2023-06-22: qty 30, 30d supply, fill #1

## 2023-01-23 MED ORDER — ZEPBOUND 15 MG/0.5ML ~~LOC~~ SOAJ
15.0000 mg | SUBCUTANEOUS | 2 refills | Status: DC
Start: 2023-01-23 — End: 2023-12-23
  Filled 2023-01-23 – 2023-10-23 (×2): qty 2, 28d supply, fill #0

## 2023-01-23 MED ORDER — BUPROPION HCL ER (XL) 300 MG PO TB24
300.0000 mg | ORAL_TABLET | Freq: Every day | ORAL | 1 refills | Status: DC
  Filled 2023-01-23: qty 90, 90d supply, fill #0
  Filled 2023-05-23: qty 30, 30d supply, fill #1
  Filled 2023-06-22: qty 30, 30d supply, fill #2
  Filled 2023-07-30: qty 30, 30d supply, fill #3

## 2023-01-28 ENCOUNTER — Other Ambulatory Visit (HOSPITAL_COMMUNITY): Payer: Self-pay

## 2023-01-29 ENCOUNTER — Other Ambulatory Visit (HOSPITAL_COMMUNITY): Payer: Self-pay

## 2023-02-03 ENCOUNTER — Other Ambulatory Visit (HOSPITAL_COMMUNITY): Payer: Self-pay

## 2023-02-06 ENCOUNTER — Other Ambulatory Visit (HOSPITAL_COMMUNITY): Payer: Self-pay

## 2023-02-07 ENCOUNTER — Other Ambulatory Visit (HOSPITAL_COMMUNITY): Payer: Self-pay

## 2023-02-18 DIAGNOSIS — G4733 Obstructive sleep apnea (adult) (pediatric): Secondary | ICD-10-CM | POA: Diagnosis not present

## 2023-02-18 DIAGNOSIS — F4322 Adjustment disorder with anxiety: Secondary | ICD-10-CM | POA: Diagnosis not present

## 2023-02-27 DIAGNOSIS — M1711 Unilateral primary osteoarthritis, right knee: Secondary | ICD-10-CM | POA: Diagnosis not present

## 2023-03-06 ENCOUNTER — Other Ambulatory Visit (HOSPITAL_COMMUNITY): Payer: Self-pay

## 2023-03-07 ENCOUNTER — Other Ambulatory Visit (HOSPITAL_COMMUNITY): Payer: Self-pay

## 2023-03-07 MED ORDER — PHENTERMINE HCL 37.5 MG PO CAPS
37.5000 mg | ORAL_CAPSULE | Freq: Every day | ORAL | 1 refills | Status: DC
  Filled 2023-03-07: qty 30, 30d supply, fill #0
  Filled 2023-07-12: qty 30, 30d supply, fill #1

## 2023-03-07 MED ORDER — ZEPBOUND 15 MG/0.5ML ~~LOC~~ SOAJ
15.0000 mg | SUBCUTANEOUS | 2 refills | Status: DC
  Filled 2023-03-07 – 2023-03-10 (×2): qty 2, 28d supply, fill #0
  Filled 2023-07-30: qty 2, 28d supply, fill #1

## 2023-03-10 ENCOUNTER — Other Ambulatory Visit (HOSPITAL_COMMUNITY): Payer: Self-pay

## 2023-03-12 ENCOUNTER — Other Ambulatory Visit (HOSPITAL_COMMUNITY): Payer: Self-pay

## 2023-03-17 ENCOUNTER — Other Ambulatory Visit (HOSPITAL_COMMUNITY): Payer: Self-pay

## 2023-03-19 ENCOUNTER — Other Ambulatory Visit (HOSPITAL_COMMUNITY): Payer: Self-pay

## 2023-03-28 ENCOUNTER — Other Ambulatory Visit (HOSPITAL_COMMUNITY): Payer: Self-pay

## 2023-04-17 ENCOUNTER — Other Ambulatory Visit (HOSPITAL_COMMUNITY): Payer: Self-pay

## 2023-04-17 MED ORDER — PHENTERMINE HCL 37.5 MG PO CAPS
37.5000 mg | ORAL_CAPSULE | Freq: Every day | ORAL | 1 refills | Status: DC
Start: 2023-04-17 — End: 2023-12-23
  Filled 2023-04-17: qty 30, 30d supply, fill #0
  Filled 2023-09-21: qty 30, 30d supply, fill #1

## 2023-04-17 MED ORDER — ONDANSETRON HCL 4 MG PO TABS
4.0000 mg | ORAL_TABLET | Freq: Four times a day (QID) | ORAL | 1 refills | Status: DC | PRN
  Filled 2023-04-17: qty 30, 8d supply, fill #0
  Filled 2023-07-30: qty 30, 8d supply, fill #1

## 2023-04-17 MED ORDER — ZEPBOUND 15 MG/0.5ML ~~LOC~~ SOAJ
SUBCUTANEOUS | 2 refills | Status: DC
  Filled 2023-04-17: qty 2, 28d supply, fill #0
  Filled 2023-06-22: qty 2, 28d supply, fill #1
  Filled 2023-11-15: qty 2, 28d supply, fill #2

## 2023-04-29 ENCOUNTER — Other Ambulatory Visit (HOSPITAL_COMMUNITY): Payer: Self-pay

## 2023-05-23 ENCOUNTER — Other Ambulatory Visit (HOSPITAL_COMMUNITY): Payer: Self-pay

## 2023-05-24 ENCOUNTER — Other Ambulatory Visit (HOSPITAL_COMMUNITY): Payer: Self-pay

## 2023-05-26 ENCOUNTER — Other Ambulatory Visit: Payer: Self-pay

## 2023-05-29 ENCOUNTER — Other Ambulatory Visit (HOSPITAL_COMMUNITY): Payer: Self-pay

## 2023-05-29 MED ORDER — PHENTERMINE HCL 37.5 MG PO CAPS
37.5000 mg | ORAL_CAPSULE | Freq: Every day | ORAL | 1 refills | Status: DC
  Filled 2023-10-23: qty 30, 30d supply, fill #0
  Filled 2023-11-15: qty 30, 30d supply, fill #1

## 2023-06-04 ENCOUNTER — Other Ambulatory Visit (HOSPITAL_COMMUNITY): Payer: Self-pay

## 2023-06-04 MED ORDER — TOPIRAMATE ER 50 MG PO CAP24
1.0000 | ORAL_CAPSULE | Freq: Every day | ORAL | 3 refills | Status: AC
  Filled 2023-06-04: qty 30, 30d supply, fill #0
  Filled 2023-08-28 – 2023-11-15 (×2): qty 30, 30d supply, fill #1

## 2023-06-07 ENCOUNTER — Other Ambulatory Visit (HOSPITAL_COMMUNITY): Payer: Self-pay

## 2023-06-24 ENCOUNTER — Other Ambulatory Visit (HOSPITAL_COMMUNITY): Payer: Self-pay

## 2023-06-24 ENCOUNTER — Other Ambulatory Visit: Payer: Self-pay

## 2023-07-08 ENCOUNTER — Other Ambulatory Visit (HOSPITAL_COMMUNITY): Payer: Self-pay

## 2023-07-08 MED ORDER — PHENTERMINE HCL 37.5 MG PO CAPS
37.5000 mg | ORAL_CAPSULE | Freq: Every day | ORAL | 1 refills | Status: DC
  Filled 2023-07-08 – 2023-08-28 (×2): qty 30, 30d supply, fill #0

## 2023-07-08 MED ORDER — ONDANSETRON HCL 4 MG PO TABS
4.0000 mg | ORAL_TABLET | Freq: Four times a day (QID) | ORAL | 1 refills | Status: AC | PRN
  Filled 2023-07-08 – 2023-09-21 (×2): qty 30, 8d supply, fill #0

## 2023-07-12 ENCOUNTER — Other Ambulatory Visit (HOSPITAL_COMMUNITY): Payer: Self-pay

## 2023-07-13 ENCOUNTER — Other Ambulatory Visit: Payer: Self-pay

## 2023-07-21 ENCOUNTER — Other Ambulatory Visit (HOSPITAL_COMMUNITY): Payer: Self-pay

## 2023-07-21 ENCOUNTER — Other Ambulatory Visit (HOSPITAL_BASED_OUTPATIENT_CLINIC_OR_DEPARTMENT_OTHER): Payer: Self-pay

## 2023-07-23 ENCOUNTER — Other Ambulatory Visit (HOSPITAL_COMMUNITY): Payer: Self-pay

## 2023-07-23 MED ORDER — PHENTERMINE HCL 37.5 MG PO CAPS
37.5000 mg | ORAL_CAPSULE | Freq: Every day | ORAL | 0 refills | Status: DC
  Filled 2023-07-23: qty 30, 30d supply, fill #0

## 2023-07-30 ENCOUNTER — Other Ambulatory Visit: Payer: Self-pay

## 2023-07-30 ENCOUNTER — Other Ambulatory Visit (HOSPITAL_COMMUNITY): Payer: Self-pay

## 2023-08-04 ENCOUNTER — Other Ambulatory Visit (HOSPITAL_COMMUNITY): Payer: Self-pay

## 2023-08-04 MED ORDER — PHENTERMINE HCL 37.5 MG PO CAPS
37.5000 mg | ORAL_CAPSULE | Freq: Every day | ORAL | 1 refills | Status: DC
  Filled 2023-08-04: qty 30, 30d supply, fill #0

## 2023-08-04 MED ORDER — ZEPBOUND 15 MG/0.5ML ~~LOC~~ SOAJ
15.0000 mg | SUBCUTANEOUS | 2 refills | Status: AC
  Filled 2023-08-04 – 2023-09-21 (×2): qty 2, 28d supply, fill #0

## 2023-08-08 ENCOUNTER — Other Ambulatory Visit (HOSPITAL_COMMUNITY): Payer: Self-pay

## 2023-08-28 ENCOUNTER — Other Ambulatory Visit (HOSPITAL_COMMUNITY): Payer: Self-pay

## 2023-08-28 ENCOUNTER — Other Ambulatory Visit: Payer: Self-pay

## 2023-08-28 MED ORDER — BUPROPION HCL ER (XL) 300 MG PO TB24
300.0000 mg | ORAL_TABLET | Freq: Every day | ORAL | 1 refills | Status: AC
  Filled 2023-08-28: qty 30, 30d supply, fill #0
  Filled 2023-09-21: qty 30, 30d supply, fill #1
  Filled 2023-10-23: qty 30, 30d supply, fill #2
  Filled 2023-11-15: qty 30, 30d supply, fill #3

## 2023-08-29 ENCOUNTER — Other Ambulatory Visit (HOSPITAL_COMMUNITY): Payer: Self-pay

## 2023-09-16 ENCOUNTER — Other Ambulatory Visit (HOSPITAL_COMMUNITY): Payer: Self-pay

## 2023-09-16 MED ORDER — PHENTERMINE HCL 37.5 MG PO CAPS
37.5000 mg | ORAL_CAPSULE | Freq: Every day | ORAL | 0 refills | Status: AC
  Filled 2023-09-16: qty 30, 30d supply, fill #0

## 2023-09-22 ENCOUNTER — Other Ambulatory Visit (HOSPITAL_COMMUNITY): Payer: Self-pay

## 2023-10-23 ENCOUNTER — Other Ambulatory Visit (HOSPITAL_COMMUNITY): Payer: Self-pay

## 2023-10-25 ENCOUNTER — Other Ambulatory Visit (HOSPITAL_COMMUNITY): Payer: Self-pay

## 2023-11-17 ENCOUNTER — Other Ambulatory Visit: Payer: Self-pay

## 2023-11-17 ENCOUNTER — Other Ambulatory Visit (HOSPITAL_COMMUNITY): Payer: Self-pay

## 2023-11-18 ENCOUNTER — Other Ambulatory Visit (HOSPITAL_COMMUNITY): Payer: Self-pay

## 2023-11-18 ENCOUNTER — Other Ambulatory Visit: Payer: Self-pay

## 2023-11-19 ENCOUNTER — Other Ambulatory Visit (HOSPITAL_COMMUNITY): Payer: Self-pay

## 2023-12-01 ENCOUNTER — Other Ambulatory Visit (HOSPITAL_COMMUNITY): Payer: Self-pay

## 2023-12-23 ENCOUNTER — Ambulatory Visit

## 2023-12-23 VITALS — Ht 60.0 in | Wt 230.0 lb

## 2023-12-23 DIAGNOSIS — Z800 Family history of malignant neoplasm of digestive organs: Secondary | ICD-10-CM

## 2023-12-23 DIAGNOSIS — Z1211 Encounter for screening for malignant neoplasm of colon: Secondary | ICD-10-CM

## 2023-12-23 MED ORDER — NA SULFATE-K SULFATE-MG SULF 17.5-3.13-1.6 GM/177ML PO SOLN: 1.0000 | Freq: Once | ORAL | 0 refills | Status: AC

## 2023-12-23 NOTE — Progress Notes (Signed)
 No issues known to pt with past sedation with any surgeries or procedures Patient denies ever being told they had issues or difficulty with intubation  No FH of Malignant Hyperthermia Pt is not on diet pills; pt does take GLP-1 medication Pt is not on home 02  Pt is not on blood thinners  Pt denies issues with chronic constipation  No A fib or A flutter Have any cardiac testing pending--no Pt instructed to use Single[employer] or GoodRx for a price reduction on prep  Ambulates independently

## 2024-01-05 ENCOUNTER — Encounter: Payer: Self-pay | Admitting: Gastroenterology

## 2024-01-06 ENCOUNTER — Encounter: Payer: Self-pay | Admitting: Gastroenterology

## 2024-01-06 ENCOUNTER — Ambulatory Visit: Admitting: Gastroenterology

## 2024-01-06 VITALS — BP 121/71 | HR 66 | Temp 98.1°F | Resp 11 | Ht 60.0 in | Wt 230.0 lb

## 2024-01-06 DIAGNOSIS — D124 Benign neoplasm of descending colon: Secondary | ICD-10-CM

## 2024-01-06 DIAGNOSIS — Z1211 Encounter for screening for malignant neoplasm of colon: Secondary | ICD-10-CM | POA: Diagnosis present

## 2024-01-06 DIAGNOSIS — K5730 Diverticulosis of large intestine without perforation or abscess without bleeding: Secondary | ICD-10-CM

## 2024-01-06 DIAGNOSIS — Z860101 Personal history of adenomatous and serrated colon polyps: Secondary | ICD-10-CM | POA: Diagnosis not present

## 2024-01-06 MED ORDER — SODIUM CHLORIDE 0.9 % IV SOLN: 500.0000 mL | Freq: Once | INTRAVENOUS | Status: DC

## 2024-01-06 NOTE — Progress Notes (Signed)
 Pt's states no medical or surgical changes since previsit or office visit.

## 2024-01-06 NOTE — Patient Instructions (Signed)
 Resume previous diet  Continue present medications  Await pathology results  See handouts on diverticulosis and polyps  YOU HAD AN ENDOSCOPIC PROCEDURE TODAY AT THE [facility]:   Refer to the procedure report that was given to you for any specific questions about what was found during the examination.  If the procedure report does not answer your questions, please call your gastroenterologist to clarify.  If you requested that your care partner not be given the details of your procedure findings, then the procedure report has been included in a sealed envelope for you to review at your convenience later.  YOU SHOULD EXPECT: Some feelings of bloating in the abdomen. Passage of more gas than usual.  Walking can help get rid of the air that was put into your GI tract during the procedure and reduce the bloating. If you had a lower endoscopy (such as a colonoscopy or flexible sigmoidoscopy) you may notice spotting of blood in your stool or on the toilet paper. If you underwent a bowel prep for your procedure, you may not have a normal bowel movement for a few days.  Please Note:  You might notice some irritation and congestion in your nose or some drainage.  This is from the oxygen used during your procedure.  There is no need for concern and it should clear up in a day or so.  SYMPTOMS TO REPORT IMMEDIATELY: Following lower endoscopy (colonoscopy or flexible sigmoidoscopy):  Excessive amounts of blood in the stool  Significant tenderness or worsening of abdominal pains  Swelling of the abdomen that is new, acute  Fever of 100F or higher  For urgent or emergent issues, a gastroenterologist can be reached at any hour by calling [phone]. Do not use MyChart messaging for urgent concerns.   DIET:  We do recommend a small meal at first, but then you may proceed to your regular diet.  Drink plenty of fluids but you should avoid alcoholic beverages for 24 hours.  ACTIVITY:  You  should plan to take it easy for the rest of today and you should NOT DRIVE or use heavy machinery until tomorrow (because of the sedation medicines used during the test).    FOLLOW UP: Our staff will call the number listed on your records the next business day following your procedure.  We will call around 7:15- 8:00 am to check on you and address any questions or concerns that you may have regarding the information given to you following your procedure. If we do not reach you, we will leave a message.     If any biopsies were taken you will be contacted by phone or by letter within the next 1-3 weeks.  Please call us  at [phone] if you have not heard about the biopsies in 3 weeks.   SIGNATURES/CONFIDENTIALITY: You and/or your care partner have signed paperwork which will be entered into your electronic medical record.  These signatures attest to the fact that that the information above on your After Visit Summary has been reviewed and is understood.  Full responsibility of the confidentiality of this discharge information lies with you and/or your care-partner.

## 2024-01-06 NOTE — Progress Notes (Unsigned)
 Pt sedate, gd SR's, VSS, report to RN

## 2024-01-06 NOTE — Op Note (Signed)
 [facility] Patient Name: Angela Ward Procedure Date: 01/06/2024 7:40 AM MRN: [id number] Endoscopist: Glendia E. Stacia , MD, [id number] Age: 58 Referring MD:  Date of Birth: 01-08-1966 Gender: Female Account #: [id number] Procedure:                Colonoscopy Indications:              High risk colon cancer surveillance: Personal                            history of colonic polyps, Surveillance: Personal                            history of adenomatous polyps on last colonoscopy >                            5 years ago; normal colonoscopy 2013, reportedly                            normal colonoscopy 2018 (reports not available);                            uncle with colon cancer Medicines:                Monitored Anesthesia Care Procedure:                Pre-Anesthesia Assessment:                           - Prior to the procedure, a History and Physical                            was performed, and patient medications and                            allergies were reviewed. The patient's tolerance of                            previous anesthesia was also reviewed. The risks                            and benefits of the procedure and the sedation                            options and risks were discussed with the patient.                            All questions were answered, and informed consent                            was obtained. Prior Anticoagulants: The patient has                            taken no anticoagulant or antiplatelet agents. ASA  Grade Assessment: III - A patient with severe                            systemic disease. After reviewing the risks and                            benefits, the patient was deemed in satisfactory                            condition to undergo the procedure.                           After obtaining informed consent, the colonoscope                            was passed under direct  vision. Throughout the                            procedure, the patient's blood pressure, pulse, and                            oxygen saturations were monitored continuously. The                            CF [id number] #[id number] was introduced through the anus                            and advanced to the the terminal ileum, with                            identification of the appendiceal orifice and IC                            valve. The colonoscopy was performed without                            difficulty. The patient tolerated the procedure                            well. The quality of the bowel preparation was                            excellent. The terminal ileum, ileocecal valve,                            appendiceal orifice, and rectum were photographed.                            The bowel preparation used was SUPREP via split                            dose instruction. Scope In: 8:02:22 AM Scope Out: 8:17:20 AM Scope Withdrawal Time: 0 hours 10 minutes 22 seconds  Total Procedure Duration: 0 hours  14 minutes 58 seconds  Findings:                 The perianal and digital rectal examinations were                            normal. Pertinent negatives include normal                            sphincter tone and no palpable rectal lesions.                           A 4 mm polyp was found in the descending colon. The                            polyp was sessile. The polyp was removed with a                            cold snare. Resection and retrieval were complete.                            Estimated blood loss was minimal.                           A few small-mouthed diverticula were found in the                            sigmoid colon and descending colon.                           The exam was otherwise normal throughout the                            examined colon.                           The terminal ileum appeared normal.                           The  retroflexed view of the distal rectum and anal                            verge was normal and showed no anal or rectal                            abnormalities. Complications:            No immediate complications. Estimated Blood Loss:     Estimated blood loss was minimal. Impression:               - One 4 mm polyp in the descending colon, removed                            with a cold snare. Resected and retrieved.                           -  Mild diverticulosis in the sigmoid colon and in                            the descending colon.                           - The examined portion of the ileum was normal.                           - The distal rectum and anal verge are normal on                            retroflexion view. Recommendation:           - Patient has a contact number available for                            emergencies. The signs and symptoms of potential                            delayed complications were discussed with the                            patient. Return to normal activities tomorrow.                            Written discharge instructions were provided to the                            patient.                           - Resume previous diet.                           - Continue present medications.                           - Await pathology results.                           - Repeat colonoscopy (date not yet determined) for                            surveillance based on pathology results. Shawnee Gambone E. Stacia, MD 01/06/2024 8:21:33 AM This report has been signed electronically.

## 2024-01-06 NOTE — Progress Notes (Unsigned)
 [facility] History and Physical   Primary Care Physician:  Aletha Bene, MD   Reason for Procedure:   Colon cancer screening  Plan:    Screening colonoscopy   HPI: Angela Ward is a 58 y.o. female undergoing colonoscopy for colon cancer screening.  She has no family history of colon cancer (other than an uncle) and no chronic GI symptoms.  The EHR indicates a history of colon polyps, but the patient denies having polyps.  She says she had normal colonoscopies in 2013 and 2018   The patient was provided an opportunity to ask questions and all were answered. The patient agreed with the plan    Past Medical History:  Diagnosis Date   Allergy    Anemia    Anxiety    Arthritis    Sleep apnea     Past Surgical History:  Procedure Laterality Date   BILATERAL CARPAL TUNNEL RELEASE     CHOLECYSTECTOMY     COLONOSCOPY  1999   HIP ARTHROPLASTY      Prior to Admission medications   Medication Sig Start Date End Date Taking? Authorizing Provider  buPROPion  (WELLBUTRIN  XL) 300 MG 24 hr tablet Take 1 tablet (300 mg total) by mouth daily. 08/28/23  Yes   cetirizine (ZYRTEC) 10 MG tablet Take 10 mg by mouth. 02/28/20  Yes [provider]  meloxicam  (MOBIC ) 15 MG tablet Take 1 tablet (15 mg total) by mouth daily. 07/23/21  Yes   sertraline  (ZOLOFT ) 100 MG tablet Take 1.5 tablets (150 mg total) by mouth daily. 07/23/21  Yes   Topiramate  ER (TROKENDI  XR) 50 MG CP24 Take 1 capsule (50 mg total) by mouth daily. Patient taking differently: Take 1 capsule by mouth daily as needed. 06/04/23  Yes   fluticasone (FLONASE) 50 MCG/ACT nasal spray Place 1 spray into the nose daily as needed. 02/06/15   [provider]  ondansetron  (ZOFRAN ) 4 MG tablet Take 1 tablet (4 mg total) by mouth every 6 (six) hours as needed. 07/08/23     phentermine  37.5 MG capsule Take 1 capsule (37.5 mg total) by mouth daily. 09/16/23     tirzepatide  (ZEPBOUND ) 15 MG/0.5ML Pen Inject 15 mg  into the skin once a week. 08/04/23     montelukast  (SINGULAIR ) 10 MG tablet Take 1 tablet (10 mg total) by mouth at bedtime. 05/17/22 08/27/22      Current Outpatient Medications  Medication Sig Dispense Refill   buPROPion  (WELLBUTRIN  XL) 300 MG 24 hr tablet Take 1 tablet (300 mg total) by mouth daily. 90 tablet 1   cetirizine (ZYRTEC) 10 MG tablet Take 10 mg by mouth.     meloxicam  (MOBIC ) 15 MG tablet Take 1 tablet (15 mg total) by mouth daily. 90 tablet 1   sertraline  (ZOLOFT ) 100 MG tablet Take 1.5 tablets (150 mg total) by mouth daily. 135 tablet 1   Topiramate  ER (TROKENDI  XR) 50 MG CP24 Take 1 capsule (50 mg total) by mouth daily. (Patient taking differently: Take 1 capsule by mouth daily as needed.) 30 capsule 3   fluticasone (FLONASE) 50 MCG/ACT nasal spray Place 1 spray into the nose daily as needed.     ondansetron  (ZOFRAN ) 4 MG tablet Take 1 tablet (4 mg total) by mouth every 6 (six) hours as needed. 30 tablet 1   phentermine  37.5 MG capsule Take 1 capsule (37.5 mg total) by mouth daily. 30 capsule 0   tirzepatide  (ZEPBOUND ) 15 MG/0.5ML Pen Inject 15 mg into the skin once  a week. 2 mL 2   Current Facility-Administered Medications  Medication Dose Route Frequency Provider Last Rate Last Admin   0.9 %  sodium chloride infusion  500 mL Intravenous Once Stacia Glendia BRAVO, MD        Allergies as of 01/06/2024   (No Known Allergies)    Family History  Problem Relation Age of Onset   Colon cancer Maternal Uncle    Esophageal cancer Neg Hx    Stomach cancer Neg Hx    Rectal cancer Neg Hx     Social History   Socioeconomic History   Marital status: Married    Spouse name: Not on file   Number of children: Not on file   Years of education: Not on file   Highest education level: Not on file  Occupational History   Not on file  Tobacco Use   Smoking status: Never   Smokeless tobacco: Never  Vaping Use   Vaping status: Never Used  Substance and Sexual Activity   Alcohol  use: Not Currently   Drug use: Not Currently   Sexual activity: Not on file  Other Topics Concern   Not on file  Social History Narrative   Not on file   Social Drivers of Health   Financial Resource Strain: Not on file  Food Insecurity: Low Risk  (07/24/2023)   Received from [facility]   Hunger Vital Sign    Within the past 12 months, you worried that your food would run out before you got money to buy more: Never true    Within the past 12 months, the food you bought just didn't last and you didn't have money to get more. : Never true  Transportation Needs: No Transportation Needs (07/24/2023)   Received from [facility]    In the past 12 months, has lack of reliable transportation kept you from medical appointments, meetings, work or from getting things needed for daily living? : No  Physical Activity: Not on file  Stress: Not on file  Social Connections: Unknown (02/01/2022)   Received from [facility]   Social Network    Social Network: Not on file  Intimate Partner Violence: Unknown (02/01/2022)   Received from [facility]   HITS    Physically Hurt: Not on file    Insult or Talk Down To: Not on file    Threaten Physical Harm: Not on file    Scream or Curse: Not on file    Review of Systems:  All other review of systems negative except as mentioned in the HPI.  Physical Exam: Vital signs BP 130/61   Pulse 73   Temp 98.1 F (36.7 C)   Ht 5' (1.524 m)   Wt 230 lb (104.3 kg)   LMP 02/23/2014   SpO2 98%   BMI 44.92 kg/m   General:   Alert,  Well-developed, well-nourished, pleasant and cooperative in NAD Airway:  Mallampati 2 Lungs:  Clear throughout to auscultation.   Heart:  Regular rate and rhythm; no murmurs, clicks, rubs,  or gallops. Abdomen:  Soft, nontender and nondistended. Normal bowel sounds.   Neuro/Psych:  Normal mood and affect. A and O x 3   Lowery Paullin E. Stacia, MD [facility]

## 2024-01-06 NOTE — Progress Notes (Signed)
 Called to room to assist during endoscopic procedure.  Patient ID and intended procedure confirmed with present staff. Received instructions for my participation in the procedure from the performing physician.

## 2024-01-07 ENCOUNTER — Telehealth: Payer: Self-pay | Admitting: Lactation Services

## 2024-01-07 NOTE — Telephone Encounter (Signed)
 No answer -no voice mail set up.

## 2024-01-08 LAB — SURGICAL PATHOLOGY

## 2024-01-14 ENCOUNTER — Ambulatory Visit: Payer: Self-pay | Admitting: Gastroenterology

## 2024-01-14 NOTE — Progress Notes (Signed)
 Angela Ward,  The polyp which I removed during your recent procedure was proven to be completely benign but is considered a pre-cancerous polyp that MAY have grown into cancer if it had not been removed.  Studies shows that at least 20% of women over age 58 and 30% of men over age 97 have pre-cancerous polyps.  Based on current nationally recognized surveillance guidelines, I recommend that you have a repeat colonoscopy in 7 years.   If you develop any new rectal bleeding, abdominal pain or significant bowel habit changes, please contact me before then.

## 2024-02-05 ENCOUNTER — Other Ambulatory Visit (HOSPITAL_COMMUNITY): Payer: Self-pay

## 2024-02-05 MED ORDER — ZEPBOUND 15 MG/0.5ML ~~LOC~~ SOAJ
15.0000 mg | SUBCUTANEOUS | 2 refills | Status: AC
  Filled 2024-02-05 – 2024-03-18 (×3): qty 2, 28d supply, fill #0

## 2024-02-27 ENCOUNTER — Other Ambulatory Visit (HOSPITAL_COMMUNITY): Payer: Self-pay

## 2024-03-04 ENCOUNTER — Other Ambulatory Visit (HOSPITAL_COMMUNITY): Payer: Self-pay

## 2024-03-06 ENCOUNTER — Other Ambulatory Visit (HOSPITAL_COMMUNITY): Payer: Self-pay

## 2024-03-18 ENCOUNTER — Other Ambulatory Visit (HOSPITAL_COMMUNITY): Payer: Self-pay

## 2024-03-19 ENCOUNTER — Other Ambulatory Visit (HOSPITAL_COMMUNITY): Payer: Self-pay
